# Patient Record
Sex: Female | Born: 1985 | Race: Asian | Hispanic: No | Marital: Married | Smoking: Never smoker
Health system: Southern US, Community
[De-identification: ages and names within clinical notes are randomized; demographics above are authoritative.]

## PROBLEM LIST (undated history)

## (undated) DIAGNOSIS — O24419 Gestational diabetes mellitus in pregnancy, unspecified control: Secondary | ICD-10-CM

## (undated) DIAGNOSIS — Z789 Other specified health status: Secondary | ICD-10-CM

## (undated) HISTORY — DX: Other specified health status: Z78.9

---

## 2017-03-08 LAB — OB RESULTS CONSOLE RUBELLA ANTIBODY, IGM: Rubella: IMMUNE

## 2017-03-08 LAB — OB RESULTS CONSOLE ANTIBODY SCREEN: Antibody Screen: NEGATIVE

## 2017-03-08 LAB — OB RESULTS CONSOLE ABO/RH: RH TYPE: POSITIVE

## 2017-03-08 LAB — OB RESULTS CONSOLE GC/CHLAMYDIA
CHLAMYDIA, DNA PROBE: NEGATIVE
Gonorrhea: NEGATIVE

## 2017-03-08 LAB — OB RESULTS CONSOLE HIV ANTIBODY (ROUTINE TESTING): HIV: NONREACTIVE

## 2017-03-08 LAB — OB RESULTS CONSOLE RPR: RPR: NONREACTIVE

## 2017-03-08 LAB — OB RESULTS CONSOLE HEPATITIS B SURFACE ANTIGEN: Hepatitis B Surface Ag: NEGATIVE

## 2017-03-28 NOTE — L&D Delivery Note (Signed)
Patient was C/C/+2 and pushed for <715minutes with no epidural.   NSVD female infant, Apgars 9/9, weight pending.   The patient had bilateral periurethral abrasions not requiring suture. Fundus was firm. EBL was expected amount. Placenta was delivered intact. Vagina was clear.  Delayed cord clamping done for 30-60 seconds while warming baby. Baby was vigorous and doing skin to skin with mother.  Philip AspenALLAHAN, Kyon Bentler

## 2017-08-02 ENCOUNTER — Encounter: Payer: Medicaid Other | Attending: Obstetrics | Admitting: Registered"

## 2017-08-02 DIAGNOSIS — O24419 Gestational diabetes mellitus in pregnancy, unspecified control: Secondary | ICD-10-CM | POA: Diagnosis not present

## 2017-08-02 DIAGNOSIS — Z713 Dietary counseling and surveillance: Secondary | ICD-10-CM | POA: Insufficient documentation

## 2017-08-07 ENCOUNTER — Encounter: Payer: Self-pay | Admitting: Registered"

## 2017-08-07 DIAGNOSIS — Z8632 Personal history of gestational diabetes: Secondary | ICD-10-CM | POA: Insufficient documentation

## 2017-08-07 DIAGNOSIS — O24419 Gestational diabetes mellitus in pregnancy, unspecified control: Secondary | ICD-10-CM | POA: Insufficient documentation

## 2017-08-07 NOTE — Progress Notes (Signed)
Patient was seen on 08/02/2017 for Gestational Diabetes self-management class at the [facility]. The following learning objectives were met by the patient during this course:   States the definition of Gestational Diabetes  States why dietary management is important in controlling blood glucose  Describes the effects each nutrient has on blood glucose levels  Demonstrates ability to create a balanced meal plan  Demonstrates carbohydrate counting   States when to check blood glucose levels  Demonstrates proper blood glucose monitoring techniques  States the effect of stress and exercise on blood glucose levels  States the importance of limiting caffeine and abstaining from alcohol and smoking  Blood glucose monitor given: Accu-Chek Guide Lot # [id number] Exp: 07/01/2018 Blood glucose reading: 92 mg/dl  Patient instructed to monitor glucose levels: FBS: 60 - <95; 1 hour: <140; 2 hour: <120  Patient received handouts:  Nutrition Diabetes and Pregnancy, including carb counting list  Patient will be seen for follow-up as needed.

## 2017-09-04 LAB — OB RESULTS CONSOLE GBS: GBS: NEGATIVE

## 2017-09-18 ENCOUNTER — Telehealth (HOSPITAL_COMMUNITY): Payer: Self-pay | Admitting: *Deleted

## 2017-09-18 ENCOUNTER — Other Ambulatory Visit: Payer: Self-pay | Admitting: Obstetrics and Gynecology

## 2017-09-18 ENCOUNTER — Encounter (HOSPITAL_COMMUNITY): Payer: Self-pay | Admitting: *Deleted

## 2017-09-18 NOTE — Telephone Encounter (Signed)
Preadmission screen  

## 2017-09-25 ENCOUNTER — Inpatient Hospital Stay (HOSPITAL_COMMUNITY)
Admission: AD | Admit: 2017-09-25 | Discharge: 2017-09-27 | DRG: 797 | Disposition: A | Discharge status: Home / Self Care | Payer: Medicaid Other | Attending: Obstetrics and Gynecology | Admitting: Obstetrics and Gynecology

## 2017-09-25 ENCOUNTER — Encounter (HOSPITAL_COMMUNITY): Payer: Self-pay

## 2017-09-25 DIAGNOSIS — O9089 Other complications of the puerperium, not elsewhere classified: Secondary | ICD-10-CM | POA: Diagnosis not present

## 2017-09-25 DIAGNOSIS — Z3A38 38 weeks gestation of pregnancy: Secondary | ICD-10-CM | POA: Diagnosis not present

## 2017-09-25 DIAGNOSIS — Z3490 Encounter for supervision of normal pregnancy, unspecified, unspecified trimester: Secondary | ICD-10-CM

## 2017-09-25 DIAGNOSIS — O9081 Anemia of the puerperium: Secondary | ICD-10-CM | POA: Diagnosis not present

## 2017-09-25 DIAGNOSIS — D62 Acute posthemorrhagic anemia: Secondary | ICD-10-CM | POA: Diagnosis not present

## 2017-09-25 DIAGNOSIS — Z3483 Encounter for supervision of other normal pregnancy, third trimester: Secondary | ICD-10-CM | POA: Diagnosis present

## 2017-09-25 DIAGNOSIS — R000 Tachycardia, unspecified: Secondary | ICD-10-CM | POA: Diagnosis not present

## 2017-09-25 HISTORY — DX: Gestational diabetes mellitus in pregnancy, unspecified control: O24.419

## 2017-09-25 LAB — CBC
HCT: 25.6 % — ABNORMAL LOW (ref 36.0–46.0)
HEMATOCRIT: 38.1 % (ref 36.0–46.0)
HEMATOCRIT: 31.1 % — AB (ref 36.0–46.0)
HEMOGLOBIN: 12.3 g/dL (ref 12.0–15.0)
Hemoglobin: 9.9 g/dL — ABNORMAL LOW (ref 12.0–15.0)
Hemoglobin: 8.4 g/dL — ABNORMAL LOW (ref 12.0–15.0)
MCH: 28 pg (ref 26.0–34.0)
MCH: 27.7 pg (ref 26.0–34.0)
MCH: 28.7 pg (ref 26.0–34.0)
MCHC: 32.3 g/dL (ref 30.0–36.0)
MCHC: 31.8 g/dL (ref 30.0–36.0)
MCHC: 32.8 g/dL (ref 30.0–36.0)
MCV: 86.6 fL (ref 78.0–100.0)
MCV: 86.9 fL (ref 78.0–100.0)
MCV: 87.4 fL (ref 78.0–100.0)
Platelets: 190 10*3/uL (ref 150–400)
Platelets: 177 10*3/uL (ref 150–400)
Platelets: 158 10*3/uL (ref 150–400)
RBC: 4.4 MIL/uL (ref 3.87–5.11)
RBC: 3.58 MIL/uL — ABNORMAL LOW (ref 3.87–5.11)
RBC: 2.93 MIL/uL — ABNORMAL LOW (ref 3.87–5.11)
RDW: 13.8 % (ref 11.5–15.5)
RDW: 13.9 % (ref 11.5–15.5)
RDW: 14 % (ref 11.5–15.5)
WBC: 14.5 10*3/uL — ABNORMAL HIGH (ref 4.0–10.5)
WBC: 15.1 10*3/uL — ABNORMAL HIGH (ref 4.0–10.5)
WBC: 16.5 10*3/uL — ABNORMAL HIGH (ref 4.0–10.5)

## 2017-09-25 LAB — RPR: RPR Ser Ql: NONREACTIVE

## 2017-09-25 LAB — ABO/RH: ABO/RH(D): O POS

## 2017-09-25 LAB — PREPARE RBC (CROSSMATCH)

## 2017-09-25 LAB — POCT FERN TEST: POCT FERN TEST: POSITIVE

## 2017-09-25 MED ORDER — ZOLPIDEM TARTRATE 5 MG PO TABS: 5.0000 mg | ORAL_TABLET | Freq: Every evening | ORAL | Status: DC | PRN

## 2017-09-25 MED ORDER — LACTATED RINGERS IV SOLN: INTRAVENOUS | Status: DC

## 2017-09-25 MED ORDER — EPHEDRINE 5 MG/ML INJ
10.0000 mg | INTRAVENOUS | Status: DC | PRN
  Filled 2017-09-25: qty 2

## 2017-09-25 MED ORDER — ONDANSETRON HCL 4 MG/2ML IJ SOLN: 4.0000 mg | Freq: Four times a day (QID) | INTRAMUSCULAR | Status: DC | PRN

## 2017-09-25 MED ORDER — COCONUT OIL OIL: 1.0000 "application " | TOPICAL_OIL | Status: DC | PRN

## 2017-09-25 MED ORDER — CARBOPROST TROMETHAMINE 250 MCG/ML IM SOLN
250.0000 ug | Freq: Once | INTRAMUSCULAR | Status: AC
  Administered 2017-09-25: 250 ug via INTRAMUSCULAR

## 2017-09-25 MED ORDER — SIMETHICONE 80 MG PO CHEW: 80.0000 mg | CHEWABLE_TABLET | ORAL | Status: DC | PRN

## 2017-09-25 MED ORDER — IBUPROFEN 600 MG PO TABS
600.0000 mg | ORAL_TABLET | Freq: Four times a day (QID) | ORAL | Status: DC
  Administered 2017-09-25 (×2): 600 mg via ORAL
  Filled 2017-09-25 (×2): qty 1

## 2017-09-25 MED ORDER — DIPHENHYDRAMINE HCL 25 MG PO CAPS: 25.0000 mg | ORAL_CAPSULE | Freq: Four times a day (QID) | ORAL | Status: DC | PRN

## 2017-09-25 MED ORDER — METHYLERGONOVINE MALEATE 0.2 MG/ML IJ SOLN
0.2000 mg | INTRAMUSCULAR | Status: DC | PRN
  Administered 2017-09-25: 0.2 mg via INTRAMUSCULAR
  Filled 2017-09-25: qty 1

## 2017-09-25 MED ORDER — OXYTOCIN 40 UNITS IN LACTATED RINGERS INFUSION - SIMPLE MED
INTRAVENOUS | Status: AC
  Filled 2017-09-25: qty 1000

## 2017-09-25 MED ORDER — OXYCODONE-ACETAMINOPHEN 5-325 MG PO TABS: 2.0000 | ORAL_TABLET | ORAL | Status: DC | PRN

## 2017-09-25 MED ORDER — ACETAMINOPHEN 325 MG PO TABS: 650.0000 mg | ORAL_TABLET | ORAL | Status: DC | PRN

## 2017-09-25 MED ORDER — OXYTOCIN 40 UNITS IN LACTATED RINGERS INFUSION - SIMPLE MED
10.0000 [IU]/h | INTRAVENOUS | Status: DC
  Administered 2017-09-25: 10 [IU]/h via INTRAVENOUS

## 2017-09-25 MED ORDER — SENNOSIDES-DOCUSATE SODIUM 8.6-50 MG PO TABS: 2.0000 | ORAL_TABLET | ORAL | Status: DC

## 2017-09-25 MED ORDER — LACTATED RINGERS IV SOLN: 500.0000 mL | INTRAVENOUS | Status: DC | PRN

## 2017-09-25 MED ORDER — FENTANYL 2.5 MCG/ML BUPIVACAINE 1/10 % EPIDURAL INFUSION (WH - ANES): 14.0000 mL/h | INTRAMUSCULAR | Status: DC | PRN

## 2017-09-25 MED ORDER — PRENATAL MULTIVITAMIN CH: 1.0000 | ORAL_TABLET | Freq: Every day | ORAL | Status: DC

## 2017-09-25 MED ORDER — MISOPROSTOL 200 MCG PO TABS
800.0000 ug | ORAL_TABLET | Freq: Once | ORAL | Status: AC
Start: 2017-09-25 — End: 2017-09-25
  Administered 2017-09-25: 800 ug via RECTAL
  Filled 2017-09-25: qty 4

## 2017-09-25 MED ORDER — BENZOCAINE-MENTHOL 20-0.5 % EX AERO: 1.0000 "application " | INHALATION_SPRAY | CUTANEOUS | Status: DC | PRN

## 2017-09-25 MED ORDER — TRANEXAMIC ACID 1000 MG/10ML IV SOLN
1000.0000 mg | Freq: Once | INTRAVENOUS | Status: DC
  Filled 2017-09-25: qty 10

## 2017-09-25 MED ORDER — OXYTOCIN 40 UNITS IN LACTATED RINGERS INFUSION - SIMPLE MED
2.5000 [IU]/h | INTRAVENOUS | Status: DC
  Administered 2017-09-25: 36 [IU]/h via INTRAVENOUS
  Filled 2017-09-25: qty 1000

## 2017-09-25 MED ORDER — PHENYLEPHRINE 40 MCG/ML (10ML) SYRINGE FOR IV PUSH (FOR BLOOD PRESSURE SUPPORT)
80.0000 ug | PREFILLED_SYRINGE | INTRAVENOUS | Status: DC | PRN
  Filled 2017-09-25: qty 5

## 2017-09-25 MED ORDER — LIDOCAINE HCL (PF) 1 % IJ SOLN
30.0000 mL | INTRAMUSCULAR | Status: DC | PRN
  Filled 2017-09-25: qty 30

## 2017-09-25 MED ORDER — LACTATED RINGERS IV SOLN: 500.0000 mL | Freq: Once | INTRAVENOUS | Status: DC

## 2017-09-25 MED ORDER — TRANEXAMIC ACID 1000 MG/10ML IV SOLN
1000.0000 mg | Freq: Once | INTRAVENOUS | Status: AC
  Administered 2017-09-25: 1000 mg via INTRAVENOUS
  Filled 2017-09-25: qty 1100

## 2017-09-25 MED ORDER — DIPHENHYDRAMINE HCL 50 MG/ML IJ SOLN: 12.5000 mg | INTRAMUSCULAR | Status: DC | PRN

## 2017-09-25 MED ORDER — METHYLERGONOVINE MALEATE 0.2 MG PO TABS
0.2000 mg | ORAL_TABLET | ORAL | Status: DC | PRN
  Administered 2017-09-25 (×2): 0.2 mg via ORAL
  Filled 2017-09-25 (×2): qty 1

## 2017-09-25 MED ORDER — OXYCODONE-ACETAMINOPHEN 5-325 MG PO TABS: 1.0000 | ORAL_TABLET | ORAL | Status: DC | PRN

## 2017-09-25 MED ORDER — SOD CITRATE-CITRIC ACID 500-334 MG/5ML PO SOLN: 30.0000 mL | ORAL | Status: DC | PRN

## 2017-09-25 MED ORDER — MISOPROSTOL 200 MCG PO TABS: 800.0000 ug | ORAL_TABLET | Freq: Once | ORAL | Status: DC

## 2017-09-25 MED ORDER — CARBOPROST TROMETHAMINE 250 MCG/ML IM SOLN
250.0000 ug | INTRAMUSCULAR | Status: DC | PRN
  Filled 2017-09-25: qty 1

## 2017-09-25 MED ORDER — OXYCODONE-ACETAMINOPHEN 5-325 MG PO TABS
2.0000 | ORAL_TABLET | ORAL | Status: DC | PRN
  Administered 2017-09-25: 2 via ORAL
  Filled 2017-09-25: qty 2

## 2017-09-25 MED ORDER — OXYTOCIN BOLUS FROM INFUSION
500.0000 mL | Freq: Once | INTRAVENOUS | Status: AC
  Administered 2017-09-25: 500 mL via INTRAVENOUS

## 2017-09-25 MED ORDER — OXYTOCIN 10 UNIT/ML IJ SOLN
INTRAMUSCULAR | Status: AC
  Filled 2017-09-25: qty 1

## 2017-09-25 MED ORDER — METHYLERGONOVINE MALEATE 0.2 MG/ML IJ SOLN: 0.2000 mg | Freq: Once | INTRAMUSCULAR | Status: DC

## 2017-09-25 MED ORDER — TETANUS-DIPHTH-ACELL PERTUSSIS 5-2.5-18.5 LF-MCG/0.5 IM SUSP: 0.5000 mL | Freq: Once | INTRAMUSCULAR | Status: DC

## 2017-09-25 MED ORDER — DIBUCAINE 1 % RE OINT: 1.0000 "application " | TOPICAL_OINTMENT | RECTAL | Status: DC | PRN

## 2017-09-25 MED ORDER — OXYCODONE-ACETAMINOPHEN 5-325 MG PO TABS
1.0000 | ORAL_TABLET | ORAL | Status: DC | PRN
  Administered 2017-09-25 – 2017-09-26 (×2): 1 via ORAL
  Filled 2017-09-25 (×2): qty 1

## 2017-09-25 MED ORDER — DIPHENOXYLATE-ATROPINE 2.5-0.025 MG PO TABS
2.0000 | ORAL_TABLET | Freq: Four times a day (QID) | ORAL | Status: DC | PRN
  Administered 2017-09-25: 2 via ORAL
  Filled 2017-09-25: qty 2

## 2017-09-25 MED ORDER — ONDANSETRON HCL 4 MG/2ML IJ SOLN: 4.0000 mg | INTRAMUSCULAR | Status: DC | PRN

## 2017-09-25 MED ORDER — WITCH HAZEL-GLYCERIN EX PADS: 1.0000 "application " | MEDICATED_PAD | CUTANEOUS | Status: DC | PRN

## 2017-09-25 MED ORDER — ONDANSETRON HCL 4 MG PO TABS: 4.0000 mg | ORAL_TABLET | ORAL | Status: DC | PRN

## 2017-09-25 MED ORDER — FLEET ENEMA 7-19 GM/118ML RE ENEM: 1.0000 | ENEMA | RECTAL | Status: DC | PRN

## 2017-09-25 NOTE — MAU Note (Signed)
UC's q 2 minutes srom 0610

## 2017-09-25 NOTE — Progress Notes (Signed)
I received several calls regarding heavier than nl PP bleeding. I contacted the delivering MD and discussed the delivery. She was not concerned about a cervical tear or retained placenta. Pt was started on methergine and cytotec 800mg  was placed in her rectum. I just checked the pt There were no clots in the vagina. She has a large cx which is edematous. Uterus is firm. There is currently very little bleeding occurring. Will check a CBC. Continue methergine

## 2017-09-25 NOTE — Progress Notes (Signed)
Received call stating that pt passed another clot and that she became dizzy when she was getting back into bed. Her uterus feels firm. Pt has received cytotec and methergine.  Plan/ 1) will check CBC          2) Hemabate 250 Im          3) Lysteda 1000mg  IV         4) T&C 2 units         5) Start IV with pit         6) If not improving will to OR for EUA and D&C. Discussed possible hyst.

## 2017-09-25 NOTE — Lactation Note (Signed)
This note was copied from a baby's chart. Lactation Consultation Note  Patient Name: Audrey Cantrell'UToday's Date: 09/25/2017 Reason for consult: Initial assessment;Early term 37-38.6wks;Other (Comment)(PPH > 1500 cc)   Initial assessment with exp BF mom of 9 hour old infant. Infant with 3 BF for 10-20 minutes, 1 BF Attempt, 1 spoon feeding of 2 cc EBM, 2 voids, 1 stools (and MSF) since birth. LATCH scores 9.   LC was informed by RN that mom has had 1500 cc blood loss. Note also indicates infant with tight anterior frenulum. Infant is noted to have anterior lingual frenulum that inserts near the tip of the tongue. Mom denies pain with feeding, frenulum was not discussed with parents at this time.   Infant was awakened by RN exam. He was placed STS and offered the breast by mom. Mom needed some help with positioning the NB at the breast. Infant was not willing to latch and was not rooting. Mom hand expressed colostrum and we spoon fed infant 2 cc colostrum. Spoon feeding demonstrated to mom.  Infant was not interested in feeding, he was left STS with mom in deep sleep. Enc mom to spoon feed infant with feedings as colostrum is available.   Enc mom to feed infant STS 8-12 x in 24 hours at first feeding cues, offering both breasts with each feeding. Enc mom to support infant well at the breast and keep infant awake as needed. Enc mom to call out for assistance as needed.   [facility] and anterior frenulum, DEBP was set up for mom. Mom was informed of how to use on initiate setting, assembling, disassembling and cleaning of pump parts. Enc mom to start pumping later this evening post infant bath and rewarming. Feeding plan written on the white board in the room. Reviewed storage of breast milk at room temperature. Asked FOB to assist mom with cleaning of pump parts. Reviewed with mom what to expect with pumping and milk coming to volume. Mom voiced concerns that she does not have enough milk, discussed  colostrum and NB nutritional needs. Mom exclusively BF her 89 yo for 6 months.   BF Resources Handout and LC Brochure given, mom informed of IP/OP Services, BF Support Groups and LC phone #. [facility], she has not spoken with them at the hospital as of yet. Mom does not have a pump at home.   Feeding plan written on the white board:  Breast feed with feeding cues 8-12 x in 24 hours Supplement with any available colostrum via spoon (extra spoons left in the room) Pump for 15 minutes on initiate setting Hand expression after pumping.   Plan of care discussed with Wendall PapaEmily Ray, RN. Plan is to follow up with mom tomorrow and prn.    Maternal Data Formula Feeding for Exclusion: No Has patient been taught Hand Expression?: Yes Does the patient have breastfeeding experience prior to this delivery?: Yes  Feeding Feeding Type: Breast Fed Length of feed: 5 min  LATCH Score Latch: Too sleepy or reluctant, no latch achieved, no sucking elicited.  Audible Swallowing: None  Type of Nipple: Everted at rest and after stimulation  Comfort (Breast/Nipple): Soft / non-tender  Hold (Positioning): Assistance needed to correctly position infant at breast and maintain latch.  LATCH Score: 5  Interventions Interventions: Breast feeding basics reviewed;Support pillows;Assisted with latch;Position options;Skin to skin;Breast massage;Breast compression;Hand express;DEBP;Expressed milk  Lactation Tools Discussed/Used Tools: Pump Breast pump type: Double-Electric Breast Pump WIC Program: Yes  Pump Review: Setup, frequency, and cleaning;Milk Storage Initiated by:: Noralee Stain, RN, IBCLC Date initiated:: 09/25/17   Consult Status Consult Status: Follow-up Date: 09/26/17 Follow-up type: In-patient    Silas Flood Brendin Situ 09/25/2017, 5:22 PM

## 2017-09-25 NOTE — H&P (Signed)
32 y.o. 834w2d  G2P0 comes in c/o ctx and LOF.  Otherwise has good fetal movement and no bleeding.  Past Medical History:  Diagnosis Date  . GDM (gestational diabetes mellitus)    No past surgical history on file.  OB History  Gravida Para Term Preterm AB Living  2         1  SAB TAB Ectopic Multiple Live Births          1    # Outcome Date GA Lbr Len/2nd Weight Sex Delivery Anes PTL Lv  2 Current           1 Gravida             Social History   Socioeconomic History  . Marital status: Married    Spouse name: Not on file  . Number of children: Not on file  . Years of education: Not on file  . Highest education level: Not on file  Occupational History  . Not on file  Social Needs  . Financial resource strain: Not on file  . Food insecurity:    Worry: Not on file    Inability: Not on file  . Transportation needs:    Medical: Not on file    Non-medical: Not on file  Tobacco Use  . Smoking status: Never Smoker  . Smokeless tobacco: Never Used  Substance and Sexual Activity  . Alcohol use: Not on file  . Drug use: Never  . Sexual activity: Not on file  Lifestyle  . Physical activity:    Days per week: Not on file    Minutes per session: Not on file  . Stress: Not on file  Relationships  . Social connections:    Talks on phone: Not on file    Gets together: Not on file    Attends religious service: Not on file    Active member of club or organization: Not on file    Attends meetings of clubs or organizations: Not on file    Relationship status: Not on file  . Intimate partner violence:    Fear of current or ex partner: Not on file    Emotionally abused: Not on file    Physically abused: Not on file    Forced sexual activity: Not on file  Other Topics Concern  . Not on file  Social History Narrative  . Not on file   Patient has no known allergies.    Prenatal Transfer Tool  Maternal Diabetes: Yes:  Diabetes Type:  Insulin/Medication controlled Genetic  Screening: Normal Maternal Ultrasounds/Referrals: Normal Fetal Ultrasounds or other Referrals:  Fetal echo Maternal Substance Abuse:  No Significant Maternal Medications:  None Significant Maternal Lab Results: Lab values include: Group B Strep negative  Other PNC: GDMA2 on glyburide    Vitals:   09/25/17 0708  BP: (!) 156/87  Pulse: 70  Resp: 18  Temp: 97.6 F (36.4 C)  TempSrc: Oral  SpO2: 100%    Lungs/Cor:  NAD Abdomen:  soft, gravid Ex:  no cords, erythema SVE:  5cm and meconium per MAU n admission Now delivered   A/P   Admitted with labor  Meconium present, NICU attended delivery  GBS Neg See delivery note  Tian Davison, Luther ParodySIDNEY

## 2017-09-26 ENCOUNTER — Inpatient Hospital Stay (HOSPITAL_COMMUNITY): Payer: Medicaid Other | Admitting: Anesthesiology

## 2017-09-26 ENCOUNTER — Encounter (HOSPITAL_COMMUNITY): Payer: Self-pay | Admitting: Obstetrics and Gynecology

## 2017-09-26 ENCOUNTER — Encounter (HOSPITAL_COMMUNITY)
Admission: AD | Disposition: A | Discharge status: Home / Self Care | Payer: Self-pay | Source: Home / Self Care | Attending: Obstetrics and Gynecology

## 2017-09-26 DIAGNOSIS — Z3490 Encounter for supervision of normal pregnancy, unspecified, unspecified trimester: Secondary | ICD-10-CM

## 2017-09-26 HISTORY — PX: DILATION AND CURETTAGE OF UTERUS: SHX78

## 2017-09-26 LAB — CBC
HCT: 18.5 % — ABNORMAL LOW (ref 36.0–46.0)
HEMATOCRIT: 24.5 % — AB (ref 36.0–46.0)
HEMOGLOBIN: 6.1 g/dL — AB (ref 12.0–15.0)
HEMOGLOBIN: 8.1 g/dL — AB (ref 12.0–15.0)
MCH: 28.6 pg (ref 26.0–34.0)
MCH: 28.7 pg (ref 26.0–34.0)
MCHC: 33 g/dL (ref 30.0–36.0)
MCHC: 33.1 g/dL (ref 30.0–36.0)
MCV: 86.9 fL (ref 78.0–100.0)
MCV: 86.9 fL (ref 78.0–100.0)
Platelets: 135 10*3/uL — ABNORMAL LOW (ref 150–400)
Platelets: 155 10*3/uL (ref 150–400)
RBC: 2.13 MIL/uL — ABNORMAL LOW (ref 3.87–5.11)
RBC: 2.82 MIL/uL — AB (ref 3.87–5.11)
RDW: 14.1 % (ref 11.5–15.5)
RDW: 14.1 % (ref 11.5–15.5)
WBC: 18.3 10*3/uL — ABNORMAL HIGH (ref 4.0–10.5)
WBC: 17.5 10*3/uL — AB (ref 4.0–10.5)

## 2017-09-26 LAB — DIC (DISSEMINATED INTRAVASCULAR COAGULATION) PANEL
FIBRINOGEN: 532 mg/dL — AB (ref 210–475)
PROTHROMBIN TIME: 12.6 s (ref 11.4–15.2)
SMEAR REVIEW: NONE SEEN

## 2017-09-26 LAB — DIC (DISSEMINATED INTRAVASCULAR COAGULATION)PANEL
D-Dimer, Quant: 1.4 ug/mL-FEU — ABNORMAL HIGH (ref 0.00–0.50)
INR: 0.95
Platelets: 155 10*3/uL (ref 150–400)
aPTT: 28 seconds (ref 24–36)

## 2017-09-26 LAB — POSTPARTUM HEMORRHAGE PROTOCOL (BB NOTIFICATION)

## 2017-09-26 SURGERY — DILATION AND CURETTAGE
Anesthesia: Regional

## 2017-09-26 MED ORDER — IBUPROFEN 600 MG PO TABS
600.0000 mg | ORAL_TABLET | Freq: Four times a day (QID) | ORAL | Status: DC
  Administered 2017-09-26 – 2017-09-27 (×5): 600 mg via ORAL
  Filled 2017-09-26 (×5): qty 1

## 2017-09-26 MED ORDER — FENTANYL CITRATE (PF) 250 MCG/5ML IJ SOLN
INTRAMUSCULAR | Status: AC
  Filled 2017-09-26: qty 5

## 2017-09-26 MED ORDER — ONDANSETRON HCL 4 MG/2ML IJ SOLN: 4.0000 mg | INTRAMUSCULAR | Status: DC | PRN

## 2017-09-26 MED ORDER — PROPOFOL 10 MG/ML IV BOLUS
INTRAVENOUS | Status: DC | PRN
  Administered 2017-09-26: 150 mg via INTRAVENOUS

## 2017-09-26 MED ORDER — FENTANYL CITRATE (PF) 100 MCG/2ML IJ SOLN
INTRAMUSCULAR | Status: DC | PRN
  Administered 2017-09-26: 50 ug via INTRAVENOUS
  Administered 2017-09-26: 100 ug via INTRAVENOUS

## 2017-09-26 MED ORDER — MIDAZOLAM HCL 2 MG/2ML IJ SOLN
INTRAMUSCULAR | Status: AC
  Filled 2017-09-26: qty 2

## 2017-09-26 MED ORDER — ONDANSETRON HCL 4 MG PO TABS: 4.0000 mg | ORAL_TABLET | ORAL | Status: DC | PRN

## 2017-09-26 MED ORDER — MIDAZOLAM HCL 2 MG/2ML IJ SOLN
INTRAMUSCULAR | Status: DC | PRN
  Administered 2017-09-26: 1 mg via INTRAVENOUS

## 2017-09-26 MED ORDER — OXYCODONE HCL 5 MG/5ML PO SOLN: 5.0000 mg | Freq: Once | ORAL | Status: DC | PRN

## 2017-09-26 MED ORDER — SIMETHICONE 80 MG PO CHEW: 80.0000 mg | CHEWABLE_TABLET | ORAL | Status: DC | PRN

## 2017-09-26 MED ORDER — TETANUS-DIPHTH-ACELL PERTUSSIS 5-2.5-18.5 LF-MCG/0.5 IM SUSP: 0.5000 mL | Freq: Once | INTRAMUSCULAR | Status: DC

## 2017-09-26 MED ORDER — DEXAMETHASONE SODIUM PHOSPHATE 4 MG/ML IJ SOLN
INTRAMUSCULAR | Status: AC
Start: 2017-09-26 — End: ?
  Filled 2017-09-26: qty 1

## 2017-09-26 MED ORDER — CEFAZOLIN SODIUM-DEXTROSE 2-3 GM-%(50ML) IV SOLR
INTRAVENOUS | Status: DC | PRN
  Administered 2017-09-26: 2 g via INTRAVENOUS

## 2017-09-26 MED ORDER — FENTANYL CITRATE (PF) 100 MCG/2ML IJ SOLN
INTRAMUSCULAR | Status: AC
  Filled 2017-09-26: qty 4

## 2017-09-26 MED ORDER — ONDANSETRON HCL 4 MG/2ML IJ SOLN
INTRAMUSCULAR | Status: DC | PRN
  Administered 2017-09-26: 4 mg via INTRAVENOUS

## 2017-09-26 MED ORDER — LIDOCAINE HCL (CARDIAC) PF 100 MG/5ML IV SOSY
PREFILLED_SYRINGE | INTRAVENOUS | Status: AC
  Filled 2017-09-26: qty 5

## 2017-09-26 MED ORDER — CARBOPROST TROMETHAMINE 250 MCG/ML IM SOLN
INTRAMUSCULAR | Status: AC
  Filled 2017-09-26: qty 1

## 2017-09-26 MED ORDER — MEASLES, MUMPS & RUBELLA VAC ~~LOC~~ INJ
0.5000 mL | INJECTION | Freq: Once | SUBCUTANEOUS | Status: DC
  Filled 2017-09-26: qty 0.5

## 2017-09-26 MED ORDER — CARBOPROST TROMETHAMINE 250 MCG/ML IM SOLN
INTRAMUSCULAR | Status: AC
  Administered 2017-09-26: 250 ug via INTRAMUSCULAR
  Filled 2017-09-26: qty 1

## 2017-09-26 MED ORDER — SENNOSIDES-DOCUSATE SODIUM 8.6-50 MG PO TABS
2.0000 | ORAL_TABLET | ORAL | Status: DC
  Administered 2017-09-26: 2 via ORAL
  Filled 2017-09-26: qty 2

## 2017-09-26 MED ORDER — CEFAZOLIN SODIUM-DEXTROSE 2-4 GM/100ML-% IV SOLN
INTRAVENOUS | Status: AC
  Filled 2017-09-26: qty 100

## 2017-09-26 MED ORDER — ONDANSETRON HCL 4 MG/2ML IJ SOLN
INTRAMUSCULAR | Status: AC
  Filled 2017-09-26: qty 2

## 2017-09-26 MED ORDER — FERROUS SULFATE 325 (65 FE) MG PO TABS
325.0000 mg | ORAL_TABLET | Freq: Two times a day (BID) | ORAL | Status: DC
  Administered 2017-09-26 – 2017-09-27 (×3): 325 mg via ORAL
  Filled 2017-09-26 (×7): qty 1

## 2017-09-26 MED ORDER — DIBUCAINE 1 % RE OINT: 1.0000 "application " | TOPICAL_OINTMENT | RECTAL | Status: DC | PRN

## 2017-09-26 MED ORDER — METHYLERGONOVINE MALEATE 0.2 MG/ML IJ SOLN
INTRAMUSCULAR | Status: AC
  Filled 2017-09-26: qty 1

## 2017-09-26 MED ORDER — PROPOFOL 10 MG/ML IV BOLUS
INTRAVENOUS | Status: AC
  Filled 2017-09-26: qty 20

## 2017-09-26 MED ORDER — ZOLPIDEM TARTRATE 5 MG PO TABS: 5.0000 mg | ORAL_TABLET | Freq: Every evening | ORAL | Status: DC | PRN

## 2017-09-26 MED ORDER — WITCH HAZEL-GLYCERIN EX PADS: 1.0000 "application " | MEDICATED_PAD | CUTANEOUS | Status: DC | PRN

## 2017-09-26 MED ORDER — METHYLERGONOVINE MALEATE 0.2 MG/ML IJ SOLN
INTRAMUSCULAR | Status: DC | PRN
  Administered 2017-09-26: 0.2 mg via INTRAMUSCULAR

## 2017-09-26 MED ORDER — LACTATED RINGERS IV SOLN
INTRAVENOUS | Status: DC | PRN
  Administered 2017-09-26 (×2): via INTRAVENOUS

## 2017-09-26 MED ORDER — CARBOPROST TROMETHAMINE 250 MCG/ML IM SOLN
250.0000 ug | Freq: Once | INTRAMUSCULAR | Status: AC
  Administered 2017-09-26: 250 ug via INTRAMUSCULAR

## 2017-09-26 MED ORDER — SUCCINYLCHOLINE CHLORIDE 200 MG/10ML IV SOSY
PREFILLED_SYRINGE | INTRAVENOUS | Status: AC
  Filled 2017-09-26: qty 10

## 2017-09-26 MED ORDER — OXYCODONE HCL 5 MG PO TABS: 5.0000 mg | ORAL_TABLET | Freq: Once | ORAL | Status: DC | PRN

## 2017-09-26 MED ORDER — COCONUT OIL OIL: 1.0000 "application " | TOPICAL_OIL | Status: DC | PRN

## 2017-09-26 MED ORDER — LIDOCAINE HCL (CARDIAC) PF 100 MG/5ML IV SOSY
PREFILLED_SYRINGE | INTRAVENOUS | Status: DC | PRN
  Administered 2017-09-26: 100 mg via INTRAVENOUS

## 2017-09-26 MED ORDER — OXYTOCIN 40 UNITS IN LACTATED RINGERS INFUSION - SIMPLE MED
INTRAVENOUS | Status: DC | PRN
  Administered 2017-09-26: 1000 mL via INTRAVENOUS

## 2017-09-26 MED ORDER — SUCCINYLCHOLINE CHLORIDE 20 MG/ML IJ SOLN
INTRAMUSCULAR | Status: DC | PRN
  Administered 2017-09-26: 100 mg via INTRAVENOUS

## 2017-09-26 MED ORDER — ONDANSETRON HCL 4 MG/2ML IJ SOLN: 4.0000 mg | Freq: Four times a day (QID) | INTRAMUSCULAR | Status: DC | PRN

## 2017-09-26 MED ORDER — BENZOCAINE-MENTHOL 20-0.5 % EX AERO: 1.0000 "application " | INHALATION_SPRAY | CUTANEOUS | Status: DC | PRN

## 2017-09-26 MED ORDER — ACETAMINOPHEN 325 MG PO TABS: 650.0000 mg | ORAL_TABLET | ORAL | Status: DC | PRN

## 2017-09-26 MED ORDER — FENTANYL CITRATE (PF) 100 MCG/2ML IJ SOLN: 25.0000 ug | INTRAMUSCULAR | Status: DC | PRN

## 2017-09-26 SURGICAL SUPPLY — 15 items
CATH ROBINSON RED A/P 16FR (CATHETERS) ×2 IMPLANT
CLOTH BEACON ORANGE TIMEOUT ST (SAFETY) ×2 IMPLANT
DECANTER SPIKE VIAL GLASS SM (MISCELLANEOUS) ×2 IMPLANT
GLOVE BIOGEL PI IND STRL 7.0 (GLOVE) ×1 IMPLANT
GLOVE BIOGEL PI INDICATOR 7.0 (GLOVE) ×1
GLOVE ECLIPSE 7.0 STRL STRAW (GLOVE) ×4 IMPLANT
GOWN STRL REUS W/TWL LRG LVL3 (GOWN DISPOSABLE) ×6 IMPLANT
PACK VAGINAL MINOR WOMEN LF (CUSTOM PROCEDURE TRAY) ×2 IMPLANT
PAD OB MATERNITY 4.3X12.25 (PERSONAL CARE ITEMS) ×2 IMPLANT
PAD PREP 24X48 CUFFED NSTRL (MISCELLANEOUS) ×2 IMPLANT
SET BERKELEY SUCTION TUBING (SUCTIONS) ×2 IMPLANT
TOWEL OR 17X24 6PK STRL BLUE (TOWEL DISPOSABLE) ×4 IMPLANT
VACURETTE 12 RIGID CVD (CANNULA) ×2 IMPLANT
VACURETTE 14MM CVD 1/2 BASE (CANNULA) ×2 IMPLANT
WATER STERILE IRR 1000ML POUR (IV SOLUTION) ×2 IMPLANT

## 2017-09-26 NOTE — Anesthesia Procedure Notes (Signed)
Procedure Name: Intubation Date/Time: 09/26/2017 2:55 AM Performed by: Elenore Paddy, CRNA Pre-anesthesia Checklist: Patient identified, Emergency Drugs available, Suction available, Patient being monitored and Timeout performed Patient Re-evaluated:Patient Re-evaluated prior to induction Oxygen Delivery Method: Circle system utilized Preoxygenation: Pre-oxygenation with 100% oxygen Induction Type: IV induction Laryngoscope Size: Mac and 3 Grade View: Grade I Number of attempts: 1 Airway Equipment and Method: Stylet Dental Injury: Teeth and Oropharynx as per pre-operative assessment

## 2017-09-26 NOTE — Progress Notes (Signed)
Post Partum Day 1, POD #1 Subjective: Complaining of dizziness with sitting and standing. Bleeding now minimal. Denies nausea and vomiting  Objective: Blood pressure 111/67, pulse (!) 106, temperature 98 F (36.7 C), temperature source Oral, resp. rate 18, height 5\' 3"  (1.6 m), weight 73.5 kg (162 lb), SpO2 100 %, unknown if currently breastfeeding.  Vitals:   09/26/17 0500 09/26/17 0555 09/26/17 0655 09/26/17 0900  BP: 92/71 106/74 114/72 111/67  Pulse: 70 84 98 (!) 106  Resp: 20 18  18   Temp: 97.7 F (36.5 C) 98.7 F (37.1 C)  98 F (36.7 C)  TempSrc: Oral Oral  Oral  SpO2: 100% 99% 98% 100%  Weight:      Height:         Physical Exam:  General: alert, cooperative and appears stated age Lochia: appropriate Uterine Fundus: firm SCDs on  Recent Labs    09/25/17 2339 09/26/17 0625  HGB 8.1* 6.1*  HCT 24.5* 18.5*    Assessment/Plan: 1) Pt with blood loss anemia and dizziness with sitting up. Discussed options for management including blood transfusion. R/B/A reviewed. The patient declines a blood transfusion at this time 2) Discussed importance of ambulation to decrease risk of DVT/PE. Advised patient that the steady can be used to assist patient to the bathroom 3) Pt desires circ but will not consent to circ without discussing with husband first. Husband not available at this time   LOS: 1 day   Waynard ReedsKendra Toyna Erisman 09/26/2017, 10:03 AM

## 2017-09-26 NOTE — Progress Notes (Signed)
Pt had large clots expressed with fundus firm and 1 below umbilicus during each fundal check. No trickle noted and Dr Dareen PianoAnderson notified of patient's EBL at 8pm. No orders received at that time. L&D nurse O. Whitfield and A. Walker present in room at approx. 2230.No trickle of blood noted until after clots expressed with 2230 fundal check. Dr Dareen PianoAnderson on phone receiving updates on patient and orders received for LR, Hemabate and Lysteda, also type and cross for 2 units PRBC. Dr Dareen PianoAnderson in room at 2250 to assess pt. Pt became pale, diaphoretic and nauseous at approx. 2300. VSS. Fundus remained firm and large clots expressed. EBL measured with each fundal check and Dr Dareen PianoAnderson updated throughout event. At approx 0140, Dr Dareen PianoAnderson was called and notified that nurse was uncomfortable with patient's condition and requested he come examine patient in room. At 0150, Dr Dareen PianoAnderson in room and cervical exam done on patient. Prior to exam, Dr. Dareen PianoAnderson stated he did not see any clots and was shown peri pads from prior fundal checks, as well as pads on patient at the time. Dr Dareen PianoAnderson then stated to prep patient for the OR for D&C, possible hysterectomy.

## 2017-09-26 NOTE — Anesthesia Postprocedure Evaluation (Signed)
Anesthesia Post Note  Patient: Audrey BottomMaryam Cantrell  Procedure(s) Performed: DILATATION AND CURETTAGE (N/A )     Patient location during evaluation: Mother Baby Anesthesia Type: Regional and General Level of consciousness: awake and alert and oriented Pain management: pain level controlled Vital Signs Assessment: post-procedure vital signs reviewed and stable Respiratory status: spontaneous breathing and nonlabored ventilation Cardiovascular status: stable Postop Assessment: no headache, patient able to bend at knees, no backache, no apparent nausea or vomiting, epidural receding, adequate PO intake and able to ambulate Anesthetic complications: no    Last Vitals:  Vitals:   09/26/17 0555 09/26/17 0655  BP: 106/74 114/72  Pulse: 84 98  Resp: 18   Temp: 37.1 C   SpO2: 99% 98%    Last Pain:  Vitals:   09/26/17 0555  TempSrc: Oral  PainSc: 0-No pain   Pain Goal: Patients Stated Pain Goal: 3 (09/25/17 1359)               [name],[name]

## 2017-09-26 NOTE — Progress Notes (Signed)
Orthostatic vitals completed. Patient slightly orthostatic with pulse jumping from 106 to 127.  Patient complained of continued dizziness but not feeling light she will pass out.  Patient was able to ambulate to the bathroom with assistance and voided a large amount.  Patient was instructed to call for assistance up and voiced understanding that she cannot walk by herself.   Audrey Cantrell, [place] N

## 2017-09-26 NOTE — Progress Notes (Signed)
On assessment this morning patient stated she was dizzy while sitting in bed.  BP 111/67 pulse 109.  Small trickle with massage, but bladder was full.  Patient was put on the bedpan and voided 600ml.  No bleeding after void.  Dr. Tenny Crawoss was notified and stated she would discuss the possibility of giving blood with the patient when she rounded.

## 2017-09-26 NOTE — Transfer of Care (Signed)
Immediate Anesthesia Transfer of Care Note  Patient: Audrey Cantrell  Procedure(s) Performed: DILATATION AND CURETTAGE (N/A )  Patient Location: PACU  Anesthesia Type:General  Level of Consciousness: awake, alert  and oriented  Airway & Oxygen Therapy: Patient Spontanous Breathing and Patient connected to nasal cannula oxygen  Post-op Assessment: Report given to RN and Post -op Vital signs reviewed and stable  Post vital signs: Reviewed and stable HR 77, RR 16, SaO2 100%, BP 115/82  Last Vitals:  Vitals Value Taken Time  BP 115/82 09/26/2017  3:45 AM  Temp 36.9 C 09/26/2017  3:45 AM  Pulse 80 09/26/2017  3:46 AM  Resp 15 09/26/2017  3:46 AM  SpO2 100 % 09/26/2017  3:46 AM  Vitals shown include unvalidated device data.  Last Pain:  Vitals:   09/26/17 0200  TempSrc: Oral  PainSc:       Patients Stated Pain Goal: 3 (09/25/17 1359)  Complications: No apparent anesthesia complications

## 2017-09-26 NOTE — Op Note (Signed)
NAMSarita Bottom: Mella, Jaeley MEDICAL RECORD NW:[id number] ACCOUNT [id number] DATE OF BIRTH:February 22, 1986 FACILITY: [facility] LOCATION: [id number] Evert KohlPHYSICIAN:Bethannie Iglehart E. Shizuko Wojdyla, MD  OPERATIVE REPORT  DATE OF PROCEDURE:  09/26/2017  PREOPERATIVE DIAGNOSIS:  Continued postpartum heavy bleeding.  POSTOPERATIVE DIAGNOSIS:  Possible retained products of conception.  SURGEON:  Dayna BarkerMatthew Jamoni Broadfoot, MD  ANESTHESIA:  General.  ANTIBIOTICS:  Ancef 2 grams.  DRAINS:  Foley to bedside drainage.  ESTIMATED BLOOD LOSS:  100 mL.  SPECIMENS:  Endometrial curettings and clots sent to pathology.  COMPLICATIONS:  None.  DESCRIPTION OF PROCEDURE:  The patient was taken to the operating room where she was placed on dorsal supine position.  A general anesthetic was administered without difficulty.  She was then placed in dorsal lithotomy position.  She was prepped and  draped in the usual fashion for this procedure.  A sterile speculum was placed in the vagina.  There was no evidence of any vaginal lacerations.  The cervix appeared to have no lacerations.  There was no evidence of any bleeding from the vagina or the  cervix.  At this point, a ring forceps was used to grasp the anterior cervical lip.  A suction curette was placed into the uterine cavity and copious amounts of clot and what appeared to be placenta were removed.  A sharp curettage with a banjo curette  was performed x2 followed by repeat suction.  At the conclusion of this, the patient had minimal bleeding.  An IV with 40 of Pitocin was started.  She was also given Methergine IM.    She will be taken to the recovery room in stable condition.  Instrument and lap counts correct x2.  AN/NUANCE  D:09/26/2017 T:09/26/2017 JOB:[id number]

## 2017-09-26 NOTE — Anesthesia Preprocedure Evaluation (Signed)
Anesthesia Evaluation  Patient identified by MRN, date of birth, ID band Patient awake    Reviewed: Allergy & Precautions, H&P , NPO status , Patient's Chart, lab work & pertinent test results  Airway Mallampati: II   Neck ROM: full    Dental   Pulmonary neg pulmonary ROS,    breath sounds clear to auscultation       Cardiovascular negative cardio ROS   Rhythm:regular Rate:Normal     Neuro/Psych    GI/Hepatic   Endo/Other  diabetes, Gestational  Renal/GU      Musculoskeletal   Abdominal   Peds  Hematology  (+) anemia ,   Anesthesia Other Findings   Reproductive/Obstetrics                             Anesthesia Physical Anesthesia Plan  ASA: II  Anesthesia Plan: Regional   Post-op Pain Management:    Induction: Intravenous, Rapid sequence and Cricoid pressure planned  PONV Risk Score and Plan: 2 and Ondansetron and Treatment may vary due to age or medical condition  Airway Management Planned: Oral ETT  Additional Equipment:   Intra-op Plan:   Post-operative Plan: Extubation in OR  Informed Consent: I have reviewed the patients History and Physical, chart, labs and discussed the procedure including the risks, benefits and alternatives for the proposed anesthesia with the patient or authorized representative who has indicated his/her understanding and acceptance.     Plan Discussed with: CRNA, Anesthesiologist and Surgeon  Anesthesia Plan Comments:         Anesthesia Quick Evaluation

## 2017-09-27 MED ORDER — FERROUS SULFATE 325 (65 FE) MG PO TABS: 325.0000 mg | ORAL_TABLET | Freq: Every day | ORAL | 3 refills | Status: DC

## 2017-09-27 MED ORDER — IBUPROFEN 600 MG PO TABS: 600.0000 mg | ORAL_TABLET | Freq: Four times a day (QID) | ORAL | 0 refills | Status: DC

## 2017-09-27 NOTE — Progress Notes (Signed)
Patient is doing well.  She is ambulating, voiding, tolerating PO.  Pain control is good.  Lochia is appropriate She had a PPH and hgb yesterday was 6.1.  She is still tachycardic in the 110s, but is ambulating well--denies dizziness/lightheadedness.   She is ambulating upon arrival to her room today.  Bleeding since D&C yesterday has been minimal  Vitals:   09/26/17 1127 09/26/17 1407 09/26/17 2232 09/27/17 0500  BP: 107/72 106/60 109/70 115/70  Pulse: (!) 127 (!) 109 (!) 113 (!) 118  Resp:  18 18   Temp:  98.6 F (37 C) 98.8 F (37.1 C) 98.2 F (36.8 C)  TempSrc:  Oral Oral   SpO2: 100% 98%  98%  Weight:      Height:        NAD Fundus firm Peripad w scant heme Ext: no edema  Lab Results  Component Value Date   WBC 18.3 (H) 09/26/2017   HGB 6.1 (LL) 09/26/2017   HCT 18.5 (L) 09/26/2017   MCV 86.9 09/26/2017   PLT 135 (L) 09/26/2017    --/--/O POS Performed at [facility], [address] [place], [place] [address]  (07/01 0705)/[place]  A/P 32 y.o. G2P1002 PPD#2. PPH w ABLA--is doing quite well for hgb 6.1.  Is asymptomatic at this time.  She declined blood transfusion yesterday.  Again, reviewed r/b/a to transfusion and she declines blood today.  She reports that her husband will be at home with her and the baby for the first couple of weeks and she will have good support.  Will arrange for home IV iron (injectafer) infusion since she is declining blood.    Given her minimal bleeding over the last 24 hours and frequent ambulation without symptoms, I feel that discharge this afternoon is reasonable.    Desires circumcision. Discussed r/b/a of the procedure. Reviewed that circumcision is an elective surgical procedure and not considered medically necessary. Reviewed the risks of the procedure including the risk of infection, bleeding, damage to surrounding structures, including scrotum, shaft, urethra and head of penis, and an undesired cosmetic effect requiring  additional procedures for revision. Consent signed.    [facility] HospitalDYANNA Cantrell [employer] CompanyCLARK

## 2017-09-27 NOTE — Discharge Instructions (Signed)
Pelvic rest x 6 weeks (no intercourse or tampons).  No tub baths or swimming for two weeks.    Call your doctor if you have heavy vaginal bleeding (soaking through a pad an hour or more for >2 hours in a row), temperature >101F, severe nausea, vomiting, severe or worsening abdominal pain, dizziness, shortness of breath, chest pain or any other concerns.  Please take motrin every 6 hours.  Continue your prenatal vitamin daily and add one iron tablet daily at a separate time.  We will contact you once we have scheduled your iron infusion for your anemia

## 2017-09-27 NOTE — Discharge Summary (Signed)
Obstetric Discharge Summary Reason for Admission: onset of labor Prenatal Procedures: none Intrapartum Procedures: spontaneous vaginal delivery Postpartum Procedures: D&C for PPH unresponsive to uterotonics and possible retained products of conception Complications-Operative and Postpartum: hemorrhage--post op hgb 6.1.  Pt declined blood transfusion.  On day of discharge, she had mild tachycardia, but was able to ambulate without any symptoms.   Hemoglobin  Date Value Ref Range Status  09/26/2017 6.1 (LL) 12.0 - 15.0 g/dL Final    Comment:    REPEATED TO VERIFY CRITICAL RESULT CALLED TO, READ BACK BY AND VERIFIED WITH: POTTER,K @0700  ON [id number] BY FLEMINGS    HCT  Date Value Ref Range Status  09/26/2017 18.5 (L) 36.0 - 46.0 % Final    Physical Exam:  General: alert, cooperative and appears stated age 43Lochia: appropriate Uterine Fundus: firm DVT Evaluation: No evidence of DVT seen on physical exam.  Discharge Diagnoses: Term Pregnancy-delivered  Discharge Information: Date: 09/27/2017 Activity: pelvic rest Diet: routine Medications: PNV and Ibuprofen Condition: stable Instructions: refer to practice specific booklet Discharge to: home Follow-up Information    Philip AspenCallahan, Sidney, DO Follow up in 4 week(s).   Specialty:  Obstetrics and Gynecology Contact information: [address] [place] [address] [phone]           Newborn Data: Live born female  Birth Weight: 8 lb 3 oz (3714 g) APGAR: 9, 9  Newborn Delivery   Birth date/time:  09/25/2017 07:25:00 Delivery type:  Vaginal, Spontaneous     Home with mother.  Adelayde Minney GEFFEL Kammie Scioli 09/27/2017, 8:40 AM

## 2017-09-27 NOTE — Lactation Note (Signed)
This note was copied from a baby's chart. Lactation Consultation Note; Mother reports that infant is feeding well . She denies having any breastfeeding questions or concerns. Mother reports hand expressing colostrum well. Mother reports hearing infant swallow. She denies having any nipple pain.  Report that mother had a PPH. Discussed need to post pump after each feeding until milk comes to volume  and protect her milk supply. Advised mother to do good breast massage and hand expression to stimulate milk volume.   Mother is active with WIC,. [facility]. She declined reporting that she was comfortable with using the harmony hand pump.  Mother advised to breastfeed infant on cue and feed infant at least 8-12 times in 24 hours. Discussed cluster feeding.  Advised mother to keep accurate account of all infants diapers.   Discussed treatment and prevention of engorgement. Mother was receptive to all teaching. [facility] , BFS[facility] and outpatient dept. Mother reports that she has an appt with WIC.   Patient Name: Audrey Sarita BottomMaryam Cantrell AVWUJ'WToday's Date: 09/27/2017 Reason for consult: Follow-up assessment   Maternal Data    Feeding    LATCH Score                   Interventions    Lactation Tools Discussed/Used     Consult Status Consult Status: Complete    Audrey Cantrell, Audrey Cantrell 09/27/2017, 2:39 PM

## 2017-09-29 LAB — TYPE AND SCREEN
ABO/RH(D): O POS
ANTIBODY SCREEN: NEGATIVE
UNIT DIVISION: 0
UNIT DIVISION: 0
UNIT DIVISION: 0
UNIT DIVISION: 0

## 2017-09-29 LAB — BPAM RBC
BLOOD PRODUCT EXPIRATION DATE: 201907262359
BLOOD PRODUCT EXPIRATION DATE: 201907272359
BLOOD PRODUCT EXPIRATION DATE: 201908062359
Blood Product Expiration Date: 201908072359
ISSUE DATE / TIME: 201907020259
ISSUE DATE / TIME: 201907020259
UNIT TYPE AND RH: 5100
Unit Type and Rh: 5100
Unit Type and Rh: 5100
Unit Type and Rh: 5100

## 2017-09-30 ENCOUNTER — Inpatient Hospital Stay (HOSPITAL_COMMUNITY): Admission: RE | Admit: 2017-09-30 | Payer: Medicaid Other | Source: Ambulatory Visit

## 2017-09-30 ENCOUNTER — Inpatient Hospital Stay (HOSPITAL_COMMUNITY): Payer: Medicaid Other

## 2017-12-26 ENCOUNTER — Encounter: Payer: Self-pay | Admitting: Obstetrics & Gynecology

## 2017-12-26 ENCOUNTER — Encounter: Payer: Self-pay | Admitting: *Deleted

## 2017-12-26 ENCOUNTER — Ambulatory Visit (INDEPENDENT_AMBULATORY_CARE_PROVIDER_SITE_OTHER): Payer: Medicaid Other | Admitting: Obstetrics & Gynecology

## 2017-12-26 VITALS — BP 108/72 | HR 74 | Ht 63.0 in | Wt 131.6 lb

## 2017-12-26 DIAGNOSIS — Z3046 Encounter for surveillance of implantable subdermal contraceptive: Secondary | ICD-10-CM

## 2017-12-26 DIAGNOSIS — Z30017 Encounter for initial prescription of implantable subdermal contraceptive: Secondary | ICD-10-CM

## 2017-12-26 DIAGNOSIS — Z3202 Encounter for pregnancy test, result negative: Secondary | ICD-10-CM | POA: Diagnosis not present

## 2017-12-26 LAB — POCT URINE PREGNANCY: PREG TEST UR: NEGATIVE

## 2017-12-26 MED ORDER — ETONOGESTREL 68 MG ~~LOC~~ IMPL
68.0000 mg | DRUG_IMPLANT | Freq: Once | SUBCUTANEOUS | Status: AC
  Administered 2017-12-26: 68 mg via SUBCUTANEOUS

## 2017-12-26 NOTE — Patient Instructions (Signed)
Nexplanon Instructions After Insertion   Keep bandage clean and dry for 24 hours   May use ice/Tylenol/Ibuprofen for soreness or pain   If you develop fever, drainage or increased warmth from incision site-contact office immediately  Etonogestrel implant What is this medicine? ETONOGESTREL (et oh noe JES trel) is a contraceptive (birth control) device. It is used to prevent pregnancy. It can be used for up to 3 years. This medicine may be used for other purposes; ask your health care provider or pharmacist if you have questions. COMMON BRAND NAME(S): Implanon, Nexplanon What should I tell my health care provider before I take this medicine? They need to know if you have any of these conditions: -abnormal vaginal bleeding -blood vessel disease or blood clots -cancer of the breast, cervix, or liver -depression -diabetes -gallbladder disease -headaches -heart disease or recent heart attack -high blood pressure -high cholesterol -kidney disease -liver disease -renal disease -seizures -tobacco smoker -an unusual or allergic reaction to etonogestrel, other hormones, anesthetics or antiseptics, medicines, foods, dyes, or preservatives -pregnant or trying to get pregnant -breast-feeding How should I use this medicine? This device is inserted just under the skin on the inner side of your upper arm by a health care professional. Talk to your pediatrician regarding the use of this medicine in children. Special care may be needed. Overdosage: If you think you have taken too much of this medicine contact a poison control center or emergency room at once. NOTE: This medicine is only for you. Do not share this medicine with others. What if I miss a dose? This does not apply. What may interact with this medicine? Do not take this medicine with any of the following medications: -amprenavir -bosentan -fosamprenavir This medicine may also interact with the following  medications: -barbiturate medicines for inducing sleep or treating seizures -certain medicines for fungal infections like ketoconazole and itraconazole -grapefruit juice -griseofulvin -medicines to treat seizures like carbamazepine, felbamate, oxcarbazepine, phenytoin, topiramate -modafinil -phenylbutazone -rifampin -rufinamide -some medicines to treat HIV infection like atazanavir, indinavir, lopinavir, nelfinavir, tipranavir, ritonavir -St. John's wort This list may not describe all possible interactions. Give your health care provider a list of all the medicines, herbs, non-prescription drugs, or dietary supplements you use. Also tell them if you smoke, drink alcohol, or use illegal drugs. Some items may interact with your medicine. What should I watch for while using this medicine? This product does not protect you against HIV infection (AIDS) or other sexually transmitted diseases. You should be able to feel the implant by pressing your fingertips over the skin where it was inserted. Contact your doctor if you cannot feel the implant, and use a non-hormonal birth control method (such as condoms) until your doctor confirms that the implant is in place. If you feel that the implant may have broken or become bent while in your arm, contact your healthcare provider. What side effects may I notice from receiving this medicine? Side effects that you should report to your doctor or health care professional as soon as possible: -allergic reactions like skin rash, itching or hives, swelling of the face, lips, or tongue -breast lumps -changes in emotions or moods -depressed mood -heavy or prolonged menstrual bleeding -pain, irritation, swelling, or bruising at the insertion site -scar at site of insertion -signs of infection at the insertion site such as fever, and skin redness, pain or discharge -signs of pregnancy -signs and symptoms of a blood clot such as breathing problems; changes in  vision; chest pain; severe,   sudden headache; pain, swelling, warmth in the leg; trouble speaking; sudden numbness or weakness of the face, arm or leg -signs and symptoms of liver injury like dark yellow or brown urine; general ill feeling or flu-like symptoms; light-colored stools; loss of appetite; nausea; right upper belly pain; unusually weak or tired; yellowing of the eyes or skin -unusual vaginal bleeding, discharge -signs and symptoms of a stroke like changes in vision; confusion; trouble speaking or understanding; severe headaches; sudden numbness or weakness of the face, arm or leg; trouble walking; dizziness; loss of balance or coordination Side effects that usually do not require medical attention (report to your doctor or health care professional if they continue or are bothersome): -acne -back pain -breast pain -changes in weight -dizziness -general ill feeling or flu-like symptoms -headache -irregular menstrual bleeding -nausea -sore throat -vaginal irritation or inflammation This list may not describe all possible side effects. Call your doctor for medical advice about side effects. You may report side effects to FDA at 1-800-FDA-1088. Where should I keep my medicine? This drug is given in a hospital or clinic and will not be stored at home. NOTE: This sheet is a summary. It may not cover all possible information. If you have questions about this medicine, talk to your doctor, pharmacist, or health care provider.  2018 Elsevier/Gold Standard (2015-10-01 11:19:22)  

## 2017-12-26 NOTE — Progress Notes (Signed)
Patient given informed consent, she signed consent form. Pregnancy test was negative.  Appropriate time out taken.  Patient's left arm was prepped and draped in the usual sterile fashion.. The ruler used to measure and mark insertion area.  Patient was prepped with alcohol swab and then injected with 5 ml of 1 % lidocaine.  She was prepped with betadine, Nexplanon removed from packaging,  Device confirmed in needle, then inserted full length of needle and withdrawn per handbook instructions.  There was minimal blood loss.  Patient insertion site covered with guaze and a pressure bandage to reduce any bruising.  The patient tolerated the procedure well and was given post procedure instructions. Return in about 3 months for Nexplanon check.  Adeleigh Barletta L. Harraway-Smith, M.D., [facility]

## 2017-12-26 NOTE — Progress Notes (Signed)
Patient is in the office for nexplanon insertion, last pap 03-08-17. Transferred from [facility].

## 2018-03-23 ENCOUNTER — Ambulatory Visit (INDEPENDENT_AMBULATORY_CARE_PROVIDER_SITE_OTHER): Payer: Self-pay | Admitting: Obstetrics & Gynecology

## 2018-03-23 ENCOUNTER — Encounter: Payer: Self-pay | Admitting: Obstetrics & Gynecology

## 2018-03-23 VITALS — BP 105/69 | HR 96 | Wt 133.8 lb

## 2018-03-23 DIAGNOSIS — Z975 Presence of (intrauterine) contraceptive device: Secondary | ICD-10-CM

## 2018-03-23 DIAGNOSIS — N921 Excessive and frequent menstruation with irregular cycle: Secondary | ICD-10-CM

## 2018-03-23 NOTE — Progress Notes (Signed)
Pt is here for nexplanon F/U, nexplanon inserted 10/19. No questions or concerns. Pt reports still having some irregular bleeding.

## 2018-03-23 NOTE — Progress Notes (Signed)
   Subjective:    Patient ID: Sarita BottomMaryam Figley, female    DOB: Nov 01, 1985, 32 y.o.   MRN: [id number]  HPI 32 yo married P2 (6 month and 33 year old sons) here to discuss her Nexplanon. It was placed 3 months ago. She had bleeding for 2 months straight. I has lightened up some, but still has bleeding.   Review of Systems [facility]    Objective:   Physical Exam Breathing, conversing, and ambulating normally Well nourished, well hydrated [place] female, no apparent distress Abd- benign     Assessment & Plan:  Irregular bleeding with Nexplanon- trial of IBU 200 mg TID prn If this doesn't work, I asked her to message me so that I can prescribe OCPs prn

## 2018-04-18 ENCOUNTER — Other Ambulatory Visit: Payer: Self-pay | Admitting: Obstetrics & Gynecology

## 2018-04-18 MED ORDER — NORGESTREL-ETHINYL ESTRADIOL 0.3-30 MG-MCG PO TABS: ORAL_TABLET | ORAL | 11 refills | Status: DC

## 2018-04-18 NOTE — Progress Notes (Signed)
Lo ovral prescribed to be taken prn bleeding with Nexplanon

## 2018-05-11 ENCOUNTER — Encounter: Payer: Self-pay | Admitting: *Deleted

## 2018-05-11 ENCOUNTER — Other Ambulatory Visit: Payer: Self-pay

## 2018-05-11 ENCOUNTER — Ambulatory Visit (INDEPENDENT_AMBULATORY_CARE_PROVIDER_SITE_OTHER): Payer: Medicaid Other | Admitting: Obstetrics and Gynecology

## 2018-05-11 ENCOUNTER — Encounter: Payer: Self-pay | Admitting: Obstetrics and Gynecology

## 2018-05-11 VITALS — BP 104/71 | HR 73 | Ht 63.0 in | Wt 135.0 lb

## 2018-05-11 DIAGNOSIS — Z3042 Encounter for surveillance of injectable contraceptive: Secondary | ICD-10-CM

## 2018-05-11 DIAGNOSIS — Z3046 Encounter for surveillance of implantable subdermal contraceptive: Secondary | ICD-10-CM

## 2018-05-11 MED ORDER — MEDROXYPROGESTERONE ACETATE 150 MG/ML IM SUSP
150.0000 mg | Freq: Once | INTRAMUSCULAR | Status: AC
  Administered 2018-05-11: 150 mg via INTRAMUSCULAR

## 2018-05-11 MED ORDER — MEDROXYPROGESTERONE ACETATE 150 MG/ML IM SUSP: 150.0000 mg | INTRAMUSCULAR | 4 refills | Status: DC

## 2018-05-11 NOTE — Progress Notes (Signed)
33 yo G2P2 here for nexplanon removal. Patient had nexplanon since 12/2017 and has been experiencing irregular vaginal bleeding despite using added COC. She plans on using Depo-provera today  Nexplanon Removal Patient given informed consent for removal of her Implanon, time out was performed.  Signed copy in the chart.  Appropriate time out taken. Implanon site identified.  Area prepped in usual sterile fashon. One cc of 1% lidocaine was used to anesthetize the area at the distal end of the implant. A small stab incision was made right beside the implant on the distal portion.  The implanon rod was grasped using hemostats and removed without difficulty.  There was less than 3 cc blood loss. There were no complications.  A small amount of antibiotic ointment and steri-strips were applied over the small incision.  A pressure bandage was applied to reduce any bruising.  The patient tolerated the procedure well and was given post procedure instructions.  First dose of depo-provera today Rx for depo-provera provided Patient with normal pap smear within the last 12 months

## 2018-05-11 NOTE — Progress Notes (Addendum)
Presents for removal of Nexplanon, it was inserted 12/26/2017.  C/o bleeding since insertion and fatigue.  She was placed on OCP and it has not stopped the bleeding.  She wants to switch to DEPO.  DEPO given in LD, tolerated well.   Next DEPO 05/2-16/2020  Administrations This Visit    medroxyPROGESTERone (DEPO-PROVERA) injection 150 mg    Admin Date 05/11/2018 Action Given Dose 150 mg Route Intramuscular Administered By Maretta Bees, RMA

## 2018-08-13 ENCOUNTER — Ambulatory Visit: Payer: Medicaid Other

## 2019-08-15 ENCOUNTER — Ambulatory Visit: Payer: Medicaid Other | Attending: Internal Medicine

## 2019-08-15 ENCOUNTER — Other Ambulatory Visit: Payer: Self-pay

## 2019-08-15 DIAGNOSIS — Z20822 Contact with and (suspected) exposure to covid-19: Secondary | ICD-10-CM

## 2019-08-16 LAB — SARS-COV-2, NAA 2 DAY TAT

## 2019-08-16 LAB — NOVEL CORONAVIRUS, NAA: SARS-CoV-2, NAA: NOT DETECTED

## 2020-03-17 ENCOUNTER — Encounter: Payer: Self-pay | Admitting: Obstetrics and Gynecology

## 2020-03-17 ENCOUNTER — Other Ambulatory Visit (HOSPITAL_COMMUNITY)
Admission: RE | Admit: 2020-03-17 | Discharge: 2020-03-17 | Disposition: A | Discharge status: Home / Self Care | Payer: Medicaid Other | Source: Ambulatory Visit | Attending: Obstetrics and Gynecology | Admitting: Obstetrics and Gynecology

## 2020-03-17 ENCOUNTER — Other Ambulatory Visit: Payer: Self-pay

## 2020-03-17 ENCOUNTER — Ambulatory Visit (INDEPENDENT_AMBULATORY_CARE_PROVIDER_SITE_OTHER): Payer: Medicaid Other | Admitting: Obstetrics and Gynecology

## 2020-03-17 VITALS — BP 101/69 | HR 102 | Ht 63.0 in | Wt 143.0 lb

## 2020-03-17 DIAGNOSIS — Z1151 Encounter for screening for human papillomavirus (HPV): Secondary | ICD-10-CM | POA: Insufficient documentation

## 2020-03-17 DIAGNOSIS — Z124 Encounter for screening for malignant neoplasm of cervix: Secondary | ICD-10-CM | POA: Diagnosis not present

## 2020-03-17 DIAGNOSIS — R87610 Atypical squamous cells of undetermined significance on cytologic smear of cervix (ASC-US): Secondary | ICD-10-CM | POA: Diagnosis not present

## 2020-03-17 DIAGNOSIS — Z01419 Encounter for gynecological examination (general) (routine) without abnormal findings: Secondary | ICD-10-CM | POA: Diagnosis present

## 2020-03-17 NOTE — Addendum Note (Signed)
Addended by: Kennon Portela on: 03/17/2020 11:31 AM   Modules accepted: Orders

## 2020-03-17 NOTE — Patient Instructions (Signed)
Levonorgestrel intrauterine device (IUD) What is this medicine? LEVONORGESTREL IUD (LEE voe nor jes trel) is a contraceptive (birth control) device. The device is placed inside the uterus by a healthcare professional. It is used to prevent pregnancy. This device can also be used to treat heavy bleeding that occurs during your period. This medicine may be used for other purposes; ask your health care provider or pharmacist if you have questions. COMMON BRAND NAME(S): Kyleena, LILETTA, Mirena, Skyla What should I tell my health care provider before I take this medicine? They need to know if you have any of these conditions:  abnormal Pap smear  cancer of the breast, uterus, or cervix  diabetes  endometritis  genital or pelvic infection now or in the past  have more than one sexual partner or your partner has more than one partner  heart disease  history of an ectopic or tubal pregnancy  immune system problems  IUD in place  liver disease or tumor  problems with blood clots or take blood-thinners  seizures  use intravenous drugs  uterus of unusual shape  vaginal bleeding that has not been explained  an unusual or allergic reaction to levonorgestrel, other hormones, silicone, or polyethylene, medicines, foods, dyes, or preservatives  pregnant or trying to get pregnant  breast-feeding How should I use this medicine? This device is placed inside the uterus by a health care professional. Talk to your pediatrician regarding the use of this medicine in children. Special care may be needed. Overdosage: If you think you have taken too much of this medicine contact a poison control center or emergency room at once. NOTE: This medicine is only for you. Do not share this medicine with others. What if I miss a dose? This does not apply. Depending on the brand of device you have inserted, the device will need to be replaced every 3 to 6 years if you wish to continue using this type  of birth control. What may interact with this medicine? Do not take this medicine with any of the following medications:  amprenavir  bosentan  fosamprenavir This medicine may also interact with the following medications:  aprepitant  armodafinil  barbiturate medicines for inducing sleep or treating seizures  bexarotene  boceprevir  griseofulvin  medicines to treat seizures like carbamazepine, ethotoin, felbamate, oxcarbazepine, phenytoin, topiramate  modafinil  pioglitazone  rifabutin  rifampin  rifapentine  some medicines to treat HIV infection like atazanavir, efavirenz, indinavir, lopinavir, nelfinavir, tipranavir, ritonavir  St. John's wort  warfarin This list may not describe all possible interactions. Give your health care provider a list of all the medicines, herbs, non-prescription drugs, or dietary supplements you use. Also tell them if you smoke, drink alcohol, or use illegal drugs. Some items may interact with your medicine. What should I watch for while using this medicine? Visit your doctor or health care professional for regular check ups. See your doctor if you or your partner has sexual contact with others, becomes HIV positive, or gets a sexual transmitted disease. This product does not protect you against HIV infection (AIDS) or other sexually transmitted diseases. You can check the placement of the IUD yourself by reaching up to the top of your vagina with clean fingers to feel the threads. Do not pull on the threads. It is a good habit to check placement after each menstrual period. Call your doctor right away if you feel more of the IUD than just the threads or if you cannot feel the threads at   all. The IUD may come out by itself. You may become pregnant if the device comes out. If you notice that the IUD has come out use a backup birth control method like condoms and call your health care provider. Using tampons will not change the position of the  IUD and are okay to use during your period. This IUD can be safely scanned with magnetic resonance imaging (MRI) only under specific conditions. Before you have an MRI, tell your healthcare provider that you have an IUD in place, and which type of IUD you have in place. What side effects may I notice from receiving this medicine? Side effects that you should report to your doctor or health care professional as soon as possible:  allergic reactions like skin rash, itching or hives, swelling of the face, lips, or tongue  fever, flu-like symptoms  genital sores  high blood pressure  no menstrual period for 6 weeks during use  pain, swelling, warmth in the leg  pelvic pain or tenderness  severe or sudden headache  signs of pregnancy  stomach cramping  sudden shortness of breath  trouble with balance, talking, or walking  unusual vaginal bleeding, discharge  yellowing of the eyes or skin Side effects that usually do not require medical attention (report to your doctor or health care professional if they continue or are bothersome):  acne  breast pain  change in sex drive or performance  changes in weight  cramping, dizziness, or faintness while the device is being inserted  headache  irregular menstrual bleeding within first 3 to 6 months of use  nausea This list may not describe all possible side effects. Call your doctor for medical advice about side effects. You may report side effects to FDA at 1-800-FDA-1088. Where should I keep my medicine? This does not apply. NOTE: This sheet is a summary. It may not cover all possible information. If you have questions about this medicine, talk to your doctor, pharmacist, or health care provider.  2020 Elsevier/Gold Standard (2018-01-23 13:22:01)  

## 2020-03-17 NOTE — Progress Notes (Signed)
GYNECOLOGY ANNUAL PREVENTATIVE CARE ENCOUNTER NOTE  Subjective:   Audrey Cantrell is a 34 y.o. G50P2002 female here for a routine annual gynecologic exam.  Current complaints: acne since starting the Depo Provera..   Denies abnormal vaginal bleeding, discharge, pelvic pain, problems with intercourse or other gynecologic concerns.    Gynecologic History No LMP recorded (approximate). Patient is currently sexually active  Contraception: Depo-Provera injections Last Pap: 03/08/2017. Results were: normal Last mammogram: n/a.  Obstetric History OB History  Gravida Para Term Preterm AB Living  2 2 2     2   SAB IAB Ectopic Multiple Live Births        0 2    # Outcome Date GA Lbr Len/2nd Weight Sex Delivery Anes PTL Lv  2 Term 09/25/17 [redacted]w[redacted]d 02:29 / 00:06 8 lb 3 oz (3.714 kg) M Vag-Spont None  LIV  1 Term 03/22/12     Vag-Spont   LIV    Past Medical History:  Diagnosis Date  . GDM (gestational diabetes mellitus)     Past Surgical History:  Procedure Laterality Date  . DILATION AND CURETTAGE OF UTERUS N/A 09/26/2017   Procedure: DILATATION AND CURETTAGE;  Surgeon: 11/27/2017, MD;  Location: [facility] BIRTHING SU[facility];  Service: Gynecology;  Laterality: N/A;    Current Outpatient Medications on File Prior to Visit  Medication Sig Dispense Refill  . medroxyPROGESTERone (DEPO-PROVERA) 150 MG/ML injection Inject 1 mL (150 mg total) into the muscle every 3 (three) months. 1 mL 4  . ferrous sulfate 325 (65 FE) MG tablet Take 1 tablet (325 mg total) by mouth daily with breakfast. (Patient not taking: No sig reported) 30 tablet 3  . ibuprofen (ADVIL,MOTRIN) 600 MG tablet Take 1 tablet (600 mg total) by mouth every 6 (six) hours. (Patient not taking: No sig reported) 40 tablet 0  . norgestrel-ethinyl estradiol (LO/OVRAL,CRYSELLE) 0.3-30 MG-MCG tablet Take a pill daily IF you are having bleeding. 1 Package 11  . Prenat-FeAsp-Meth-FA-DHA w/o A (PRENATE PIXIE) 10-0.6-0.4-200 MG CAPS Take 1  capsule by mouth daily. (Patient not taking: Reported on 03/17/2020)     No current facility-administered medications on file prior to visit.    No Known Allergies  Social History   Socioeconomic History  . Marital status: Married    Spouse name: Not on file  . Number of children: Not on file  . Years of education: Not on file  . Highest education level: Not on file  Occupational History  . Not on file  Tobacco Use  . Smoking status: Never Smoker  . Smokeless tobacco: Never Used  Substance and Sexual Activity  . Alcohol use: Never  . Drug use: Never  . Sexual activity: Yes  Other Topics Concern  . Not on file  Social History Narrative  . Not on file   Social Determinants of Health   Financial Resource Strain: Not on file  Food Insecurity: Not on file  Transportation Needs: Not on file  Physical Activity: Not on file  Stress: Not on file  Social Connections: Not on file  Intimate Partner Violence: Not on file    History reviewed. No pertinent family history.  The following portions of the patient's history were reviewed and updated as appropriate: allergies, current medications, past family history, past medical history, past social history, past surgical history and problem list.  Review of Systems A comprehensive review of systems was negative.   Objective:  BP 101/69   Pulse (!) 102   Ht 5'  3" (1.6 m)   Wt 143 lb (64.9 kg)   LMP  (Approximate) Comment: DEPO   BMI 25.33 kg/m  Wt Readings from Last 3 Encounters:  03/17/20 143 lb (64.9 kg)  05/11/18 135 lb (61.2 kg)  03/23/18 133 lb 12.8 oz (60.7 kg)     Chaperone present during exam  CONSTITUTIONAL: Well-developed, well-nourished female in no acute distress.  HENT:  Normocephalic, atraumatic, External right and left ear normal. Oropharynx is clear and moist EYES: Conjunctivae and EOM are normal. Pupils are equal, round, and reactive to light. No scleral icterus.  NECK: Normal range of motion, supple,  no masses.  Normal thyroid.   CARDIOVASCULAR: Normal heart rate noted, regular rhythm RESPIRATORY: Clear to auscultation bilaterally. Effort and breath sounds normal, no problems with respiration noted. BREASTS: Symmetric in size. No masses, skin changes, nipple drainage, or lymphadenopathy. ABDOMEN: Soft, normal bowel sounds, no distention noted.  No tenderness, rebound or guarding.  PELVIC: Normal appearing external genitalia; normal appearing vaginal mucosa and cervix.  No abnormal discharge noted.  Normal uterine size, no other palpable masses, no uterine or adnexal tenderness. MUSCULOSKELETAL: Normal range of motion. No tenderness.  No cyanosis, clubbing, or edema.  2+ distal pulses. SKIN: Skin is warm and dry. No rash noted. Not diaphoretic. No erythema. No pallor. NEUROLOGIC: Alert and oriented to person, place, and time. Normal reflexes, muscle tone coordination. No cranial nerve deficit noted. PSYCHIATRIC: Normal mood and affect. Normal behavior. Normal judgment and thought content.  Assessment:  Annual gynecologic examination with pap smear   Plan:  1. Well Woman Exam Will follow up results of pap smear and manage accordingly. Mammogram scheduled STD testing discussed. Patient declined testing Discussed exercise and diet Calcium and Vitamin D supplementation discussed  1. Well woman exam with routine gynecological exam - Will fu on Pap smear results. - Pamphlet given about the Mirena IUD.  Patient is considering.  If desired, will place when she is due for her next Depo (per patient, she is due in January).   Routine preventative health maintenance measures emphasized. Please refer to After Visit Summary for other counseling recommendations.   Darrin Nipper. Gerri Spore, MD [facility] for [facility]

## 2020-03-17 NOTE — Progress Notes (Signed)
Patinet presents for Annual Exam  LMP: Last month before Depo cannot recall date.  Last pap:09/26/2017 Contraception: Depo given at health Clinic per pt  STD Screening: Declines  Family Hx of Breast Cancer: None   CC: None

## 2020-03-19 LAB — CYTOLOGY - PAP
Comment: NEGATIVE
Diagnosis: UNDETERMINED — AB
High risk HPV: NEGATIVE

## 2020-06-09 ENCOUNTER — Ambulatory Visit: Payer: Medicaid Other | Admitting: Family Medicine

## 2020-06-10 ENCOUNTER — Encounter: Payer: Self-pay | Admitting: Women's Health

## 2020-06-10 ENCOUNTER — Ambulatory Visit (INDEPENDENT_AMBULATORY_CARE_PROVIDER_SITE_OTHER): Payer: Medicaid Other | Admitting: Women's Health

## 2020-06-10 ENCOUNTER — Other Ambulatory Visit: Payer: Self-pay

## 2020-06-10 VITALS — BP 111/72 | Ht 63.0 in | Wt 144.0 lb

## 2020-06-10 DIAGNOSIS — Z30011 Encounter for initial prescription of contraceptive pills: Secondary | ICD-10-CM

## 2020-06-10 DIAGNOSIS — L700 Acne vulgaris: Secondary | ICD-10-CM

## 2020-06-10 DIAGNOSIS — Z3009 Encounter for other general counseling and advice on contraception: Secondary | ICD-10-CM

## 2020-06-10 MED ORDER — TRETINOIN 0.025 % EX CREA: TOPICAL_CREAM | Freq: Every day | CUTANEOUS | 3 refills | Status: DC

## 2020-06-10 MED ORDER — NORETHIN ACE-ETH ESTRAD-FE 1-20 MG-MCG PO TABS: 1.0000 | ORAL_TABLET | Freq: Every day | ORAL | 15 refills | Status: DC

## 2020-06-10 NOTE — Patient Instructions (Signed)
When using your birth control, if you experience any of the following, please call the office or report to the nearest emergency room immediately: -severe abdominal pain/weakness -chest pain/shortness of breath -the worst HA you have ever had in your life -sudden changes in vision -difficulty speaking -severe leg pain/redness/swelling Please also refer to the additional information you were given in the office today while using your birth control.       Norethindrone Acetate; Ethinyl Estradiol; Ferrous Fumarate Capsules or Tablets What is this medicine? NORETHINDRONE; ETHINYL ESTRADIOL; FERROUS FUMARATE (nor eth IN drone; ETH in il es tra DYE ole; FER [place] FUE ma rate) is an oral contraceptive. The products combine two types of female hormones, an estrogen and a progestin. These products prevent ovulation and pregnancy. This medicine may be used for other purposes; ask your health care provider or pharmacist if you have questions. COMMON BRAND NAME(S): [place]urovela [address] 1/20, [address], [place] [address], [address] Fe, [place], [place], [address], [address] Fe 1.5/30, [place] Fe 1/20, Hailey 24 Fe, Hailey Fe 1.5/30, Junel Fe 1.5/30, Junel Fe 1/20, Junel Fe 24, [place] Fe, Lo Loestrin Fe, Loestrin 24 Fe, Loestrin FE 1.5/30, Loestrin FE 1/20, Lom[place] 24 Fe, [place], Microgestin 24 Fe, Microgestin Fe 1.5/30, Microgestin Fe 1/20, Tarina 24 Fe, Tarina Fe 1/20, Taysofy, Taytulla, Tilia Fe, Tri-Legest Fe What should I tell my health care provider before I take this medicine? They need to know if you have any of these conditions:  abnormal vaginal bleeding  blood vessel disease or blood clots  breast, cervical, endometrial, ovarian, liver, or uterine cancer  diabetes  gallbladder disease  having surgery  heart disease or recent heart attack  high blood pressure  high cholesterol or triglycerides  history of irregular heartbeat or heart valve problems  kidney disease  liver  disease  migraine headaches  protein C deficiency  protein S deficiency  recently had a baby, miscarriage, or abortion  stroke  systemic lupus erythematosus (SLE)  tobacco smoker  an unusual or allergic reaction to estrogens, progestins, other medicines, foods, dyes, or preservatives  pregnant or trying to get pregnant  breast-feeding How should I use this medicine? Take this medicine by mouth. To reduce nausea, this medicine may be taken with food. Follow the directions on the prescription label. Take this medicine at the same time each day and in the order directed on the package. Do not take your medicine more often than directed. A patient package insert for the product will be given with each prescription and refill. Read this sheet carefully each time. The sheet may change frequently. Contact your pediatrician regarding the use of this medicine in children. Special care may be needed. This medicine has been used in female children who have started having menstrual periods. Overdosage: If you think you have taken too much of this medicine contact a poison control center or emergency room at once. NOTE: This medicine is only for you. Do not share this medicine with others. What if I miss a dose? If you miss a dose, refer to the patient information sheet you received with your medication for direction. If you miss more than one pill, this medication may not be as effective, and you may need to use another form of birth control. What may interact with this medicine? Do not take this medicine with the following medication:  dasabuvir; ombitasvir; paritaprevir; ritonavir  ombitasvir; paritaprevir; ritonavir This medicine may also interact with the following medications:  acetaminophen  antibiotics  or medicines for infections, especially rifampin, rifabutin, rifapentine, and griseofulvin, and possibly penicillins or tetracyclines  aprepitant  ascorbic acid (vitamin  C)  atorvastatin  barbiturate medicines, such as phenobarbital  bosentan  carbamazepine  caffeine  clofibrate  cyclosporine  dantrolene  doxercalciferol  felbamate  grapefruit juice  hydrocortisone  medicines for anxiety or sleeping problems, such as diazepam or temazepam  medicines for diabetes, including pioglitazone  mineral oil  modafinil  mycophenolate  nefazodone  oxcarbazepine  phenytoin  prednisolone  ritonavir or other medicines for HIV infection or AIDS  rosuvastatin  selegiline  soy isoflavones supplements  St. John's wort  tamoxifen or raloxifene  theophylline  thyroid hormones  topiramate  warfarin This list may not describe all possible interactions. Give your health care provider a list of all the medicines, herbs, non-prescription drugs, or dietary supplements you use. Also tell them if you smoke, drink alcohol, or use illegal drugs. Some items may interact with your medicine. What should I watch for while using this medicine? Visit your doctor or health care professional for regular checks on your progress. You will need a regular breast and pelvic exam and Pap smear while on this medicine. Use an additional method of contraception during the first cycle that you take these tablets. If you have any reason to think you are pregnant, stop taking this medicine right away and contact your doctor or health care professional. If you are taking this medicine for hormone related problems, it may take several cycles of use to see improvement in your condition. Smoking increases the risk of getting a blood clot or having a stroke while you are taking birth control pills, especially if you are more than 36 years old. You are strongly advised not to smoke. This medicine can make your body retain fluid, making your fingers, hands, or ankles swell. Your blood pressure can go up. Contact your doctor or health care professional if you feel you  are retaining fluid. This medicine can make you more sensitive to the sun. Keep out of the sun. If you cannot avoid being in the sun, wear protective clothing and use sunscreen. Do not use sun lamps or tanning beds/booths. If you wear contact lenses and notice visual changes, or if the lenses begin to feel uncomfortable, consult your eye care specialist. In some women, tenderness, swelling, or minor bleeding of the gums may occur. Notify your dentist if this happens. Brushing and flossing your teeth regularly may help limit this. See your dentist regularly and inform your dentist of the medicines you are taking. If you are going to have elective surgery, you may need to stop taking this medicine before the surgery. Consult your health care professional for advice. This medicine does not protect you against HIV infection (AIDS) or any other sexually transmitted diseases. What side effects may I notice from receiving this medicine? Side effects that you should report to your doctor or health care professional as soon as possible:  allergic reactions such as skin rash or itching, hives, swelling of the lips, mouth, tongue, or throat  breast tissue changes or discharge  dark patches of skin on your forehead, cheeks, upper lip, and chin  depression  high blood pressure  migraines or severe, sudden headaches  signs and symptoms of a blood clot such as breathing problems; changes in vision; chest pain; severe, sudden headache; pain, swelling, warmth in the leg; trouble speaking; sudden numbness or weakness of the face, arm or leg  stomach pain  symptoms of vaginal infection like itching, irritation or unusual discharge  yellowing of the eyes or skin Side effects that usually do not require medical attention (report these to your doctor or health care professional if they continue or are bothersome):  acne  breast pain, tenderness  irregular vaginal bleeding or spotting, particularly during  the first month of use  mild headache  nausea  weight gain (slight) This list may not describe all possible side effects. Call your doctor for medical advice about side effects. You may report side effects to FDA at 1-800-FDA-1088. Where should I keep my medicine? Keep out of the reach of children. Store at room temperature between 15 and 30 degrees C (59 and 86 degrees F). Throw away any unused medicine after the expiration date. NOTE: This sheet is a summary. It may not cover all possible information. If you have questions about this medicine, talk to your doctor, pharmacist, or health care provider.  2021 Elsevier/Gold Standard (2020-02-03 12:27:45)       Tretinoin skin cream (Acne) What is this medicine? TRETINOIN (TRET i noe in) is a topical retinoid. It is used on the skin to treat acne. This medicine may be used for other purposes; ask your health care provider or pharmacist if you have questions. COMMON BRAND NAME(S): [place], AVITA, Refissa, Retin-A, Tretin-X What should I tell my health care provider before I take this medicine? They need to know if you have any of these conditions:  large areas of burned or damaged skin  an unusual or allergic reaction to tretinoin, other drugs, foods, dyes, or preservatives  pregnant or trying to get pregnant  breast-feeding How should I use this medicine? This medicine is for external use only. Do not take by mouth. Wash your hands before and after use. Do not get it in your eyes. If you do, rinse your eyes with plenty of cool tap water. Use it as directed on the prescription label at the same time every day. Do not use it more often than directed. Do not stop using it unless your health care provider tells you to stop it early. Apply a thin film of the medicine to the affected area. Do not apply to burned or damaged skin. Talk to your health care provider about the use of this medicine in children. While it may be prescribed for  children as young as 12 years for selected conditions, precautions do apply. Overdosage: If you think you have taken too much of this medicine contact a poison control center or emergency room at once. NOTE: This medicine is only for you. Do not share this medicine with others. What if I miss a dose? If you miss a dose, use it as soon as you can. If it is almost time for your next dose, use only that dose. Do not use double or extra doses. What may interact with this medicine?  medicines or other preparations that may dry your skin such as benzoyl peroxide or salicylic acid  medicines that increase your sensitivity to sunlight such as tetracycline or sulfa drugs This list may not describe all possible interactions. Give your health care provider a list of all the medicines, herbs, non-prescription drugs, or dietary supplements you use. Also tell them if you smoke, drink alcohol, or use illegal drugs. Some items may interact with your medicine. What should I watch for while using this medicine? Visit your health care provider for regular checks on your progress. It may be some time before  you see the benefit from this medicine. This medicine can make you more sensitive to the sun. Keep out of the sun, If you cannot avoid being in the sun, wear protective clothing and sunscreen. Do not use sun lamps or tanning beds/booths. Do not use other products that dry the skin. Examples include abrasive cleaners or products with alcohol in them. Do not use other acne products on the same areas of the skin as this one unless your health care provider tells you to use both. What side effects may I notice from receiving this medicine? Side effects that you should report to your doctor or health care provider as soon as possible:  allergic reactions (skin rash, itching or hives; swelling of the face, lips, or tongue)  burning, itching, crusting, peeling of treated skin Side effects that usually do not require  medical attention (report to your doctor or health care provider if they continue or are bothersome):  change in color of skin  mild skin irritation, redness, or dryness This list may not describe all possible side effects. Call your doctor for medical advice about side effects. You may report side effects to FDA at 1-800-FDA-1088. Where should I keep my medicine? Keep out of the reach of children and pets. Store at room temperature between 20 and 25 degrees C (68 and 77 degrees F). Do not freeze. Protect from light and moisture. Keep the container tightly closed. Get rid of any unused medicine after the expiration date. NOTE: This sheet is a summary. It may not cover all possible information. If you have questions about this medicine, talk to your doctor, pharmacist, or health care provider.  2021 Elsevier/Gold Standard (2019-02-25 17:52:31)

## 2020-06-10 NOTE — Progress Notes (Signed)
  History:  Audrey Cantrell is a 35 y.o. [id number] who presents to clinic today for bc and acne consult. Patient reports she was recently put on Depo for birth control, but it caused a severe breakout in acne so she no longer wishes to use this medication for birth control. Patient inquiring about birth control that can help with acne as well as topical medications.  PMH unremarkable. NKDA. No hx of HTN, DM, asthma, blood clots, migraines, liver or heart problems. Patient does not smoke.  The following portions of the patient's history were reviewed and updated as appropriate: allergies, current medications, family history, past medical history, social history, past surgical history and problem list.  Review of Systems:  Review of Systems  Gastrointestinal: Negative for abdominal pain, nausea and vomiting.  Genitourinary: Negative for dysuria, frequency and urgency.     Objective:  Physical Exam BP 111/72 (BP Location: Right Arm, Patient Position: Sitting, Cuff Size: Normal)   Ht 5\' 3"  (1.6 m)   Wt 144 lb (65.3 kg)   LMP 06/03/2020 (Exact Date)   BMI 25.51 kg/m    Physical Exam Vitals and nursing note reviewed.  Constitutional:      Appearance: Normal appearance.  HENT:     Head: Normocephalic and atraumatic.  Pulmonary:     Effort: Pulmonary effort is normal.  Neurological:     Mental Status: She is alert and oriented to person, place, and time.  Psychiatric:        Mood and Affect: Mood normal.        Behavior: Behavior normal.        Thought Content: Thought content normal.        Judgment: Judgment normal.    Labs and Imaging No results found for this or any previous visit (from the past 24 hour(s)).  No results found.   Assessment & Plan:   1. Counseling for birth control, oral contraceptives - pt advised to start pills with first day of next period - discussed administration, side effects, warning signs, time to effectiveness, back-up method PRN, protecting  against STDs. RTO 4-6wks for f/u - norethindrone-ethinyl estradiol (JUNEL FE 1/20) 1-20 MG-MCG tablet; Take 1 tablet by mouth daily.  Dispense: 28 tablet; Refill: 15  2. Acne vulgaris - tretinoin (RETIN-A) 0.025 % cream; Apply topically at bedtime.  Dispense: 45 g; Refill: 3  Approximately 10 minutes of total time was spent with this patient on counseling and coordination of care.  2/20, NP 06/10/2020 4:08 PM

## 2020-06-10 NOTE — Progress Notes (Signed)
Pt here to discuss options regarding BC. Pt states she has been on Depo and Nexplanon in the past. She is having issues with acne and hormonal changes. Pt is open to options.

## 2021-01-04 ENCOUNTER — Other Ambulatory Visit: Payer: Self-pay

## 2021-01-04 ENCOUNTER — Ambulatory Visit (INDEPENDENT_AMBULATORY_CARE_PROVIDER_SITE_OTHER): Payer: Managed Care, Other (non HMO) | Admitting: Family Medicine

## 2021-01-04 ENCOUNTER — Encounter: Payer: Self-pay | Admitting: Family Medicine

## 2021-01-04 VITALS — BP 112/60 | HR 70 | Wt 138.0 lb

## 2021-01-04 DIAGNOSIS — Z1159 Encounter for screening for other viral diseases: Secondary | ICD-10-CM

## 2021-01-04 DIAGNOSIS — Z23 Encounter for immunization: Secondary | ICD-10-CM | POA: Diagnosis not present

## 2021-01-04 DIAGNOSIS — D649 Anemia, unspecified: Secondary | ICD-10-CM | POA: Diagnosis not present

## 2021-01-04 DIAGNOSIS — G4700 Insomnia, unspecified: Secondary | ICD-10-CM

## 2021-01-04 DIAGNOSIS — Z0000 Encounter for general adult medical examination without abnormal findings: Secondary | ICD-10-CM | POA: Diagnosis not present

## 2021-01-04 DIAGNOSIS — R7303 Prediabetes: Secondary | ICD-10-CM | POA: Diagnosis not present

## 2021-01-04 DIAGNOSIS — Z862 Personal history of diseases of the blood and blood-forming organs and certain disorders involving the immune mechanism: Secondary | ICD-10-CM

## 2021-01-04 DIAGNOSIS — R5383 Other fatigue: Secondary | ICD-10-CM

## 2021-01-04 NOTE — Patient Instructions (Signed)
It was great seeing you today!  Thank you for choosing [place] for your PCP needs! Please continue to eat a balanced diet and exercise regularly multiple times a week. We will get blood work today, I will notify you of any abnormal results.   Please follow up at your next scheduled appointment in 1 month, if anything arises between now and then, please don't hesitate to contact our office.   Thank you for allowing [place] to be a part of your medical care!  Thank you, Dr. Robyne Peers

## 2021-01-05 DIAGNOSIS — R7303 Prediabetes: Secondary | ICD-10-CM | POA: Insufficient documentation

## 2021-01-05 DIAGNOSIS — Z862 Personal history of diseases of the blood and blood-forming organs and certain disorders involving the immune mechanism: Secondary | ICD-10-CM | POA: Insufficient documentation

## 2021-01-05 DIAGNOSIS — G4700 Insomnia, unspecified: Secondary | ICD-10-CM | POA: Insufficient documentation

## 2021-01-05 LAB — CBC
Hematocrit: 38 % (ref 34.0–46.6)
Hemoglobin: 12.6 g/dL (ref 11.1–15.9)
MCH: 29 pg (ref 26.6–33.0)
MCHC: 33.2 g/dL (ref 31.5–35.7)
MCV: 88 fL (ref 79–97)
Platelets: 238 10*3/uL (ref 150–450)
RBC: 4.34 x10E6/uL (ref 3.77–5.28)
RDW: 13.3 % (ref 11.7–15.4)
WBC: 7.2 10*3/uL (ref 3.4–10.8)

## 2021-01-05 LAB — BASIC METABOLIC PANEL
BUN/Creatinine Ratio: 17 (ref 9–23)
BUN: 10 mg/dL (ref 6–20)
CO2: 23 mmol/L (ref 20–29)
Calcium: 9.1 mg/dL (ref 8.7–10.2)
Chloride: 103 mmol/L (ref 96–106)
Creatinine, Ser: 0.6 mg/dL (ref 0.57–1.00)
Glucose: 162 mg/dL — ABNORMAL HIGH (ref 70–99)
Potassium: 3.8 mmol/L (ref 3.5–5.2)
Sodium: 141 mmol/L (ref 134–144)
eGFR: 119 mL/min/{1.73_m2} (ref >59)

## 2021-01-05 LAB — TSH: TSH: 2.29 u[IU]/mL (ref 0.450–4.500)

## 2021-01-05 LAB — HCV AB W REFLEX TO QUANT PCR: HCV Ab: <0.1 s/co ratio (ref 0.0–0.9)

## 2021-01-05 LAB — HCV INTERPRETATION

## 2021-01-05 LAB — HEMOGLOBIN A1C
Est. average glucose Bld gHb Est-mCnc: 108 mg/dL
Hgb A1c MFr Bld: 5.4 % (ref 4.8–5.6)

## 2021-01-05 NOTE — Assessment & Plan Note (Signed)
-  extensive sleep hygiene discussed -per patient preference, TSH obtained for fatigue -follow up in 1-2 months, consider melatonin if needed

## 2021-01-05 NOTE — Assessment & Plan Note (Signed)
-  noted during pregnancy -overall seems to be asymptomatic, pending CBC -consider further iron studies if needed to determine if iron supplementation should be started along with encouraging high iron content dietary choices

## 2021-01-05 NOTE — Assessment & Plan Note (Addendum)
-  pending BMP and A1c today given prior medical history and family history  -extensive diet and exercise counseling provided along with reassurance

## 2021-01-05 NOTE — Progress Notes (Signed)
    SUBJECTIVE:   CHIEF COMPLAINT / HPI:   Patient presents to establish care, was previously seen at [facility] in [place] but wanted a PCP switch. Husband and children are at established at [facility]. Past medical history includes gestational diabetes, history of anemia and prediabetes. Only taking minocycline for acne as needed and daily multivitamin. Denies any known drug allergies. No recent surgeries or hospitalizations. Family history significant for diabetes and hyperlipidemia in mother. Endorsing balanced diet and regular exercise. Works at [employer], recently acquired this job which she shares is going well and is very excited about. Denies tobacco, alcohol and illicit drug use. Denies caffeine use. Sexually active with 1 female partner, her husband. Sees OBGYN where she gets routine PAP screening as appropriate and shares she will continue to see them while establishing care at [facility].   Shares that she has been having difficulty with both falling and staying asleep over the past month. Sleeps about 4-5 hours a night, maybe 6-7 hours on a good night. Feels that she often thinks too much before bed. Denies any new or ongoing stressors. Reports that her mood is generally good, has not tried anything for this.   PERTINENT  PMH / PSH:   Prediabetes Reports that she was diagnosed with this last year. Unsure as to what her A1c was at the time. Shares that she works very hard to develop and maintain healthy habits, eat a balanced diet almost daily and exercises regularly. She is very active both at home and at work. Currently not on any medications. She has had gestational diabetes previously during her most recent pregnancy about 3 years ago.  History of anemia Notes anemia history although denies dizziness or weakness. Endorsing occasional fatigue which she attributes to her limited sleep quality. No relatively recent Hgb although 3 years ago was 8.1 during pregnancy.   OBJECTIVE:   BP 112/60   Pulse 70    Wt 138 lb (62.6 kg)   LMP 12/28/2020 (Approximate)   BMI 24.45 kg/m   General: Patient well-appearing, in no acute distress. HEENT: normocephalic, non-tender thyroid, no cervical LAD CV: RRR, no murmurs or gallops auscultated  Resp: CTAB Abdomen: soft, nontender, nondistended, presence of bowel sounds Ext: radial pulses strong and equal bilaterally, no LE edema noted bilaterally Neuro: normal gait Psych: mood appropriate, pleasant   ASSESSMENT/PLAN:   Insomnia -extensive sleep hygiene discussed -per patient preference, TSH obtained for fatigue -follow up in 1-2 months, consider melatonin if needed   History of anemia -noted during pregnancy -overall seems to be asymptomatic, pending CBC -consider further iron studies if needed to determine if iron supplementation should be started along with encouraging high iron content dietary choices   Prediabetes -pending BMP and A1c today given prior medical history and family history  -extensive diet and exercise counseling provided along with reassurance    -Influenza vaccination administered today.  -Pending Hepatitis C screening, will notify patient of any abnormal results   Reece Leader, DO [facility]

## 2021-02-09 ENCOUNTER — Ambulatory Visit: Payer: Medicaid Other | Admitting: Family Medicine

## 2021-02-15 ENCOUNTER — Encounter: Payer: Self-pay | Admitting: Family Medicine

## 2021-02-15 ENCOUNTER — Other Ambulatory Visit: Payer: Self-pay

## 2021-02-15 ENCOUNTER — Ambulatory Visit (INDEPENDENT_AMBULATORY_CARE_PROVIDER_SITE_OTHER): Payer: Managed Care, Other (non HMO) | Admitting: Family Medicine

## 2021-02-15 VITALS — BP 113/77 | HR 90 | Wt 140.2 lb

## 2021-02-15 DIAGNOSIS — G629 Polyneuropathy, unspecified: Secondary | ICD-10-CM

## 2021-02-15 DIAGNOSIS — L709 Acne, unspecified: Secondary | ICD-10-CM | POA: Insufficient documentation

## 2021-02-15 DIAGNOSIS — G5793 Unspecified mononeuropathy of bilateral lower limbs: Secondary | ICD-10-CM | POA: Diagnosis not present

## 2021-02-15 DIAGNOSIS — L700 Acne vulgaris: Secondary | ICD-10-CM

## 2021-02-15 NOTE — Patient Instructions (Signed)
We will follow up with you once the results are available and discuss if you would like to trial gabapentin for bed time.   Your hemoglobin A1c from your last visit was normal and did not indicate diabetes.   Please follow up in 2 weeks to check on your feet symptoms.   Neuropathic Pain Neuropathic pain is pain caused by damage to the nerves that are responsible for certain sensations in your body (sensory nerves). Neuropathic pain can make you more sensitive to pain. Even a minor sensation can feel very painful. This is usually a long-term (chronic) condition that can be difficult to treat. The type of pain differs from person to person. It may: Start suddenly (acute), or it may develop slowly and become chronic. Come and go as damaged nerves heal, or it may stay at the same level for years. Cause emotional distress, loss of sleep, and a lower quality of life. What are the causes? The most common cause of this condition is diabetes. Many other diseases and conditions can also cause neuropathic pain. Causes of neuropathic pain can be classified as: Toxic. This is caused by medicines and chemicals. The most common causes of toxic neuropathic pain is damage from medicines that kill cancer cells (chemotherapy) or alcohol abuse. Metabolic. This can be caused by: Diabetes. Lack of vitamins like B12. Traumatic. Any injury that cuts, crushes, or stretches a nerve can cause damage and pain. Compression-related. If a sensory nerve gets trapped or compressed for a long period of time, the blood supply to the nerve can be cut off. Vascular. Many blood vessel diseases can cause neuropathic pain by decreasing blood supply and oxygen to nerves. Autoimmune. This type of pain results from diseases in which the body's defense system (immune system) mistakenly attacks sensory nerves. Examples of autoimmune diseases that can cause neuropathic pain include lupus and multiple sclerosis. Infectious. Many types of  viral infections can damage sensory nerves and cause pain. Shingles infection is a common cause of this type of pain. Inherited. Neuropathic pain can be a symptom of many diseases that are passed down through families (genetic). What increases the risk? You are more likely to develop this condition if: You have diabetes. You smoke. You drink too much alcohol. You are taking certain medicines, including chemotherapy or medicines that treat immune system disorders. What are the signs or symptoms? The main symptom is pain. Neuropathic pain is often described as: Burning. Shock-like. Stinging. Hot or cold. Itching. How is this diagnosed? No single test can diagnose neuropathic pain. It is diagnosed based on: A physical exam and your symptoms. Your health care provider will ask you about your pain. You may be asked to use a pain scale to describe how bad your pain is. Tests. These may be done to see if you have a cause and location of any nerve damage. They include: Nerve conduction studies and electromyography to test how well nerve signals travel through your nerves and muscles (electrodiagnostic testing). Skin biopsy to evaluate for small fiber neuropathy. Imaging studies, such as: X-rays. CT scan. MRI. How is this treated? Treatment for neuropathic pain may change over time. You may need to try different treatment options or a combination of treatments. Some options include: Treating the underlying cause of the neuropathy, such as diabetes, kidney disease, or vitamin deficiencies. Stopping medicines that can cause neuropathy, such as chemotherapy. Medicine to relieve pain. Medicines may include: Prescription or over-the-counter pain medicine. Anti-seizure medicine. Antidepressant medicines. Pain-relieving patches or creams that  are applied to painful areas of skin. A medicine to numb the area (local anesthetic), which can be injected as a nerve block. Transcutaneous nerve  stimulation. This uses electrical currents to block painful nerve signals. The treatment is painless. Alternative treatments, such as: Acupuncture. Meditation. Massage. Occupational or physical therapy. Pain management programs. Counseling. Follow these instructions at home: Medicines  Take over-the-counter and prescription medicines only as told by your health care provider. Ask your health care provider if the medicine prescribed to you: Requires you to avoid driving or using machinery. Can cause constipation. You may need to take these actions to prevent or treat constipation: Drink enough fluid to keep your urine pale yellow. Take over-the-counter or prescription medicines. Eat foods that are high in fiber, such as beans, whole grains, and fresh fruits and vegetables. Limit foods that are high in fat and processed sugars, such as fried or sweet foods. Lifestyle  Have a good support system at home. Consider joining a chronic pain support group. Do not use any products that contain nicotine or tobacco. These products include cigarettes, chewing tobacco, and vaping devices, such as e-cigarettes. If you need help quitting, ask your health care provider. Do not drink alcohol. General instructions Learn as much as you can about your condition. Work closely with all your health care providers to find the treatment plan that works best for you. Ask your health care provider what activities are safe for you. Keep all follow-up visits. This is important. Contact a health care provider if: Your pain treatments are not working. You are having side effects from your medicines. You are struggling with tiredness (fatigue), mood changes, depression, or anxiety. Get help right away if: You have thoughts of hurting yourself. Get help right away if you feel like you may hurt yourself or others, or have thoughts about taking your own life. Go to your nearest emergency room or: Call 911. Call  the National Suicide Prevention Lifeline at (520)235-3028 or 988. This is open 24 hours a day. Text the Crisis Text Line at (406) 209-2397. Summary Neuropathic pain is pain caused by damage to the nerves that are responsible for certain sensations in your body (sensory nerves). Neuropathic pain may come and go as damaged nerves heal, or it may stay at the same level for years. Neuropathic pain is usually a long-term condition that can be difficult to treat. Consider joining a chronic pain support group. This information is not intended to replace advice given to you by your health care provider. Make sure you discuss any questions you have with your health care provider. Document Revised: 11/09/2020 Document Reviewed: 11/09/2020 Elsevier Patient Education  2022 [employer].

## 2021-02-15 NOTE — Assessment & Plan Note (Signed)
Patient previously checked for hemoglobin A1c, within normal range at 5.4.  We will check TSH, CBC, vitamin B12 and magnesium.  Patient offered 100 mg bedtime dose of gabapentin for symptoms while awaiting lab results.  Patient declines at this time stating that she would prefer to know the cause of her symptoms prior to starting new medication and she does not like taking medications.  We will follow-up with results once they are available and determine if patient would like to try gabapentin at that time.  No physical exam findings to increase suspicion for injury, we will not obtain x-rays at this time or any further imaging.

## 2021-02-15 NOTE — Assessment & Plan Note (Addendum)
Referral for dermatology submitted for evaluation of acne despite abx after end of visit. Patient encouraged to follow up with physician who prescribed retinoid treatment for further refills

## 2021-02-15 NOTE — Progress Notes (Signed)
SUBJECTIVE:   CHIEF COMPLAINT / HPI: f/u for labs and foot burning sensation    Foot burning sensation Patient states that for the last 4 years since her last pregnancy, she has been having nighttime burning sensation in her bilateral feet.  She states that the pain is somewhat alleviated by placing her feet in cool water for 15 minutes.  She reports that the pain prevents her from being able to sleep.  She states that during her pregnancy, she remembers taking gabapentin but does not remember while she was taking it or if it was effective at relieving her symptoms in her feet.  She denies any previous injuries or surgeries to her feet.  She denies any skin changes or lacerations to her feet.  She denies any numbness in her feet.  She reports that her feet just feel very warm.  Patient was tested for diabetes previously and noted to have hemoglobin A1c within normal range at 5.4.  These results were reviewed today per patient request.  Patient denies any abnormalities with ambulation due to the running sensation in her feet.  She denies that the burning sensation travels any higher than her feet.  Acne  After completing visit and giving AVS, patient mentions a request for dermatology referral. Patient states that she would like to be referred to dermatology for acne that persists despite taking minocycline for year. She reports that she initially thought this was a side effect of depo provera injections but states that since stopping the depo  shots, she continues to have acne. She would like to speak with a specialist regarding other options as she does not want to depend on the medication to clear her skin.    PERTINENT  PMH / PSH:  Hx of Anemia  Gestational DM    OBJECTIVE:   BP 113/77   Pulse 90   Wt 140 lb 3.2 oz (63.6 kg)   SpO2 97%   BMI 24.84 kg/m   Physical Exam Cardiovascular:     Pulses:          Dorsalis pedis pulses are 2+ on the right side and 2+ on the left side.        Posterior tibial pulses are 2+ on the right side and 2+ on the left side.  Musculoskeletal:     Right foot: Normal range of motion. No deformity, bunion or Charcot foot.     Left foot: Normal range of motion. No deformity, bunion or Charcot foot.  Feet:     Right foot:     Skin integrity: Skin integrity normal. No ulcer, blister, skin breakdown, erythema or warmth.     Toenail Condition: Right toenails are normal.     Left foot:     Skin integrity: Skin integrity normal. No ulcer, blister, skin breakdown, erythema or warmth.     Toenail Condition: Left toenails are normal.   ASSESSMENT/PLAN:   Neuropathic pain of both feet Patient previously checked for hemoglobin A1c, within normal range at 5.4.  We will check TSH, CBC, vitamin B12 and magnesium.  Patient offered 100 mg bedtime dose of gabapentin for symptoms while awaiting lab results.  Patient declines at this time stating that she would prefer to know the cause of her symptoms prior to starting new medication and she does not like taking medications.  We will follow-up with results once they are available and determine if patient would like to try gabapentin at that time.  No physical exam findings  to increase suspicion for injury, we will not obtain x-rays at this time or any further imaging.  Acne Referral for dermatology submitted for evaluation of acne despite abx after end of visit. Patient encouraged to follow up with physician who prescribed retinoid treatment for further refills     Ronnald Ramp, MD [facility]

## 2021-02-16 LAB — CBC
Hematocrit: 38.9 % (ref 34.0–46.6)
Hemoglobin: 12.9 g/dL (ref 11.1–15.9)
MCH: 29.5 pg (ref 26.6–33.0)
MCHC: 33.2 g/dL (ref 31.5–35.7)
MCV: 89 fL (ref 79–97)
Platelets: 185 10*3/uL (ref 150–450)
RBC: 4.38 x10E6/uL (ref 3.77–5.28)
RDW: 13.1 % (ref 11.7–15.4)
WBC: 5.9 10*3/uL (ref 3.4–10.8)

## 2021-02-16 LAB — VITAMIN B12: Vitamin B-12: 762 pg/mL (ref 232–1245)

## 2021-02-16 LAB — MAGNESIUM: Magnesium: 2 mg/dL (ref 1.6–2.3)

## 2021-02-16 LAB — TSH: TSH: 2.7 u[IU]/mL (ref 0.450–4.500)

## 2021-02-23 ENCOUNTER — Other Ambulatory Visit: Payer: Self-pay | Admitting: Family Medicine

## 2021-02-23 MED ORDER — GABAPENTIN 100 MG PO CAPS: 100.0000 mg | ORAL_CAPSULE | Freq: Every day | ORAL | 0 refills | Status: DC

## 2021-04-10 ENCOUNTER — Encounter: Payer: Self-pay | Admitting: Family Medicine

## 2021-06-16 ENCOUNTER — Other Ambulatory Visit: Payer: Self-pay

## 2021-06-16 ENCOUNTER — Ambulatory Visit (INDEPENDENT_AMBULATORY_CARE_PROVIDER_SITE_OTHER): Payer: Managed Care, Other (non HMO) | Admitting: Emergency Medicine

## 2021-06-16 VITALS — BP 113/71 | HR 83 | Ht 63.0 in | Wt 140.7 lb

## 2021-06-16 DIAGNOSIS — Z3201 Encounter for pregnancy test, result positive: Secondary | ICD-10-CM

## 2021-06-16 DIAGNOSIS — Z3200 Encounter for pregnancy test, result unknown: Secondary | ICD-10-CM

## 2021-06-16 LAB — POCT URINE PREGNANCY: Preg Test, Ur: POSITIVE — AB

## 2021-06-16 NOTE — Progress Notes (Signed)
Ms. Munroe presents today for UPT. She has no unusual complaints. ?LMP: 05/07/21 ?   ?OBJECTIVE: Appears well, in no apparent distress.  ?OB History   ? ? Gravida  ?3  ? Para  ?2  ? Term  ?2  ? Preterm  ?   ? AB  ?   ? Living  ?2  ?  ? ? SAB  ?   ? IAB  ?   ? Ectopic  ?   ? Multiple  ?0  ? Live Births  ?2  ?   ?  ?  ? ?Home UPT Result: Positive ?In-Office UPT result:Positive -test ran twice, with 2nd RN to confirm results. Very faint line. ? ? ? I have reviewed the patient's medical, obstetrical, social, and family histories, and medications.  ? ?ASSESSMENT: Positive pregnancy test- Test ran twice, with 2nd RN to confirm results. Very faint line. ? ?PLAN ?Prenatal care to be completed at: [facility] ?  ?

## 2021-06-23 ENCOUNTER — Encounter (HOSPITAL_COMMUNITY): Payer: Self-pay | Admitting: Obstetrics and Gynecology

## 2021-06-23 ENCOUNTER — Inpatient Hospital Stay (HOSPITAL_COMMUNITY): Payer: Medicaid Other

## 2021-06-23 ENCOUNTER — Inpatient Hospital Stay (HOSPITAL_COMMUNITY)
Admission: AD | Admit: 2021-06-23 | Discharge: 2021-06-23 | Disposition: A | Discharge status: Home / Self Care | Payer: Medicaid Other | Attending: Obstetrics and Gynecology | Admitting: Obstetrics and Gynecology

## 2021-06-23 DIAGNOSIS — O209 Hemorrhage in early pregnancy, unspecified: Secondary | ICD-10-CM | POA: Diagnosis present

## 2021-06-23 DIAGNOSIS — O26891 Other specified pregnancy related conditions, first trimester: Secondary | ICD-10-CM | POA: Diagnosis not present

## 2021-06-23 DIAGNOSIS — O3680X0 Pregnancy with inconclusive fetal viability, not applicable or unspecified: Secondary | ICD-10-CM | POA: Diagnosis not present

## 2021-06-23 DIAGNOSIS — R102 Pelvic and perineal pain: Secondary | ICD-10-CM | POA: Insufficient documentation

## 2021-06-23 DIAGNOSIS — Z6790 Unspecified blood type, Rh positive: Secondary | ICD-10-CM | POA: Diagnosis not present

## 2021-06-23 DIAGNOSIS — Z3A01 Less than 8 weeks gestation of pregnancy: Secondary | ICD-10-CM | POA: Diagnosis not present

## 2021-06-23 LAB — CBC
HCT: 42 % (ref 36.0–46.0)
Hemoglobin: 14 g/dL (ref 12.0–15.0)
MCH: 30.4 pg (ref 26.0–34.0)
MCHC: 33.3 g/dL (ref 30.0–36.0)
MCV: 91.3 fL (ref 80.0–100.0)
Platelets: 208 10*3/uL (ref 150–400)
RBC: 4.6 MIL/uL (ref 3.87–5.11)
RDW: 12.2 % (ref 11.5–15.5)
WBC: 8.1 10*3/uL (ref 4.0–10.5)
nRBC: 0 % (ref 0.0–0.2)

## 2021-06-23 LAB — URINALYSIS, MICROSCOPIC (REFLEX)

## 2021-06-23 LAB — URINALYSIS, ROUTINE W REFLEX MICROSCOPIC
Bilirubin Urine: NEGATIVE
Glucose, UA: NEGATIVE mg/dL
Ketones, ur: NEGATIVE mg/dL
Leukocytes,Ua: NEGATIVE
Nitrite: NEGATIVE
Protein, ur: NEGATIVE mg/dL
Specific Gravity, Urine: 1.01 (ref 1.005–1.030)
pH: 6 (ref 5.0–8.0)

## 2021-06-23 LAB — HCG, QUANTITATIVE, PREGNANCY: hCG, Beta Chain, Quant, S: 986 m[IU]/mL — ABNORMAL HIGH (ref <5)

## 2021-06-23 NOTE — MAU Note (Signed)
Audrey Cantrell is a 36 y.o. at [redacted]w[redacted]d here in MAU reporting: is pregnant. Been having bleeding since Sunday, has gotten heavier and passed clots.  Having pain, cramping in lower abd and back- started Sun night.  ?Onset of complaint: Sunday ?Pain score: 6 ?Vitals:  ? 06/23/21 1053  ?BP: 106/71  ?Pulse: 73  ?Resp: 18  ?Temp: 98.4 ?F (36.9 ?C)  ?SpO2: 100%  ?   ? ?Lab orders placed from triage:  urine ? ?

## 2021-06-23 NOTE — MAU Provider Note (Signed)
?History  ? ?CSN: [id number] ? ?Arrival date and time: 06/23/21 1038 ? ?Event Date/Time  ?First Provider Initiated Contact with Patient 06/23/21 1105   ?  ?Chief Complaint  ?Patient presents with  ? Vaginal Bleeding  ? Abdominal Pain  ? Back Pain  ? ?HPI ?Audrey Cantrell is a 36 y.o. G3P2002 at 2747w5d who presents to MAU with chief complaint of vaginal bleeding. This is a recurrent problem, onset three days ago. Patient states bleeding is "really heavy".  She is saturating pads in about 3-4 hours. She denies dizziness, weakness, syncope. ? ?Patient also c/p generalized lower abdominal pain, onset coincides with onset of bleeding. Pain score 6/10. Pain does not radiate. She has not taken medication for this complaint. She denies dysuria, abdominal tenderness, fever. She declines pain medication throughout MAU evaluation. ? ? ?OB History   ? ? Gravida  ?3  ? Para  ?2  ? Term  ?2  ? Preterm  ?   ? AB  ?   ? Living  ?2  ?  ? ? SAB  ?   ? IAB  ?   ? Ectopic  ?   ? Multiple  ?0  ? Live Births  ?2  ?   ?  ?  ? ? ?Past Medical History:  ?Diagnosis Date  ? GDM (gestational diabetes mellitus)   ? ? ?Past Surgical History:  ?Procedure Laterality Date  ? DILATION AND CURETTAGE OF UTERUS N/A 09/26/2017  ? Procedure: DILATATION AND CURETTAGE;  Surgeon: Levi AlandAnderson, Mark E, MD;  [facility];  Service: Gynecology;  Laterality: N/A;  ? ? ?No family history on file. ? ?Social History  ? ?Tobacco Use  ? Smoking status: Never  ? Smokeless tobacco: Never  ?Substance Use Topics  ? Alcohol use: Never  ? Drug use: Never  ? ? ?Allergies: No Known Allergies ? ?Medications Prior to Admission  ?Medication Sig Dispense Refill Last Dose  ? gabapentin (NEURONTIN) 100 MG capsule Take 1 capsule (100 mg total) by mouth at bedtime. (Patient not taking: Reported on 06/16/2021) 90 capsule 0   ? minocycline (MINOCIN) 50 MG capsule Take 50 mg by mouth 2 (two) times daily. (Patient not taking: Reported on 06/16/2021)     ? norethindrone-ethinyl  estradiol (JUNEL FE 1/20) 1-20 MG-MCG tablet Take 1 tablet by mouth daily. (Patient not taking: Reported on 01/04/2021) 28 tablet 15   ? Prenatal Vit-Fe Fumarate-FA (MULTIVITAMIN-PRENATAL) 27-0.8 MG TABS tablet Take 1 tablet by mouth daily at 12 noon.     ? tretinoin (RETIN-A) 0.025 % cream Apply topically at bedtime. (Patient not taking: Reported on 01/04/2021) 45 g 3   ? ? ?Review of Systems  ?Gastrointestinal:  Positive for abdominal pain.  ?Genitourinary:  Positive for vaginal bleeding.  ?All other systems reviewed and are negative. ?Physical Exam  ? ?Blood pressure 106/71, pulse 73, temperature 98.4 ?F (36.9 ?C), resp. rate 18, height 5\' 3"  (1.6 m), weight 64 kg, last menstrual period 05/07/2021, SpO2 100 %. ? ?Physical Exam ?Vitals and nursing note reviewed. Exam conducted with a chaperone present.  ?Constitutional:   ?   Appearance: She is well-developed.  ?Cardiovascular:  ?   Rate and Rhythm: Normal rate and regular rhythm.  ?   Heart sounds: Normal heart sounds.  ?Pulmonary:  ?   Effort: Pulmonary effort is normal.  ?   Breath sounds: Normal breath sounds.  ?Abdominal:  ?   General: Abdomen is flat.  ?   Palpations: Abdomen is soft.  ?  Tenderness: There is no abdominal tenderness.  ?Genitourinary: ?   Comments: Pelvic exam: External genitalia normal, vaginal walls pink and well rugated, cervix visually closed, no lesions noted. Scant dark red blood in posterior vault. Removed with fox swab x 2. ? ? ?Neurological:  ?   Mental Status: She is alert and oriented to person, place, and time.  ?Psychiatric:     ?   Mood and Affect: Mood normal.     ?   Behavior: Behavior normal.  ? ? ?MAU Course  ?Procedures ? ?MDM ?Orders Placed This Encounter  ?Procedures  ? Urinalysis, Routine w reflex microscopic Urine, Clean Catch  ? CBC  ? hCG, quantitative, pregnancy  ? ?Patient Vitals for the past 24 hrs: ? BP Temp Temp src Pulse Resp SpO2 Height Weight  ?06/23/21 1346 107/72 97.8 ?F (36.6 ?C) Oral 67 20 100 % -- --   ?06/23/21 1053 106/71 98.4 ?F (36.9 ?C) -- 73 18 100 % 5\' 3"  (1.6 m) 64 kg  ? ?Results for orders placed or performed during the hospital encounter of 06/23/21 (from the past 24 hour(s))  ?CBC     Status: None  ? Collection Time: 06/23/21 11:08 AM  ?Result Value Ref Range  ? WBC 8.1 4.0 - 10.5 K/uL  ? RBC 4.60 3.87 - 5.11 MIL/uL  ? Hemoglobin 14.0 12.0 - 15.0 g/dL  ? HCT 42.0 36.0 - 46.0 %  ? MCV 91.3 80.0 - 100.0 fL  ? MCH 30.4 26.0 - 34.0 pg  ? MCHC 33.3 30.0 - 36.0 g/dL  ? RDW 12.2 11.5 - 15.5 %  ? Platelets 208 150 - 400 K/uL  ? nRBC 0.0 0.0 - 0.2 %  ?hCG, quantitative, pregnancy     Status: Abnormal  ? Collection Time: 06/23/21 11:08 AM  ?Result Value Ref Range  ? hCG, Beta Chain, Quant, S 986 (H) <5 mIU/mL  ?Urinalysis, Routine w reflex microscopic Urine, Clean Catch     Status: Abnormal  ? Collection Time: 06/23/21 11:38 AM  ?Result Value Ref Range  ? Color, Urine YELLOW YELLOW  ? APPearance CLEAR CLEAR  ? Specific Gravity, Urine 1.010 1.005 - 1.030  ? pH 6.0 5.0 - 8.0  ? Glucose, UA NEGATIVE NEGATIVE mg/dL  ? Hgb urine dipstick LARGE (A) NEGATIVE  ? Bilirubin Urine NEGATIVE NEGATIVE  ? Ketones, ur NEGATIVE NEGATIVE mg/dL  ? Protein, ur NEGATIVE NEGATIVE mg/dL  ? Nitrite NEGATIVE NEGATIVE  ? Leukocytes,Ua NEGATIVE NEGATIVE  ?Urinalysis, Microscopic (reflex)     Status: Abnormal  ? Collection Time: 06/23/21 11:38 AM  ?Result Value Ref Range  ? RBC / HPF 6-10 0 - 5 RBC/hpf  ? WBC, UA 0-5 0 - 5 WBC/hpf  ? Bacteria, UA RARE (A) NONE SEEN  ? Squamous Epithelial / LPF 0-5 0 - 5  ? Mucus PRESENT   ? ?06/25/21 OB LESS THAN 14 WEEKS WITH OB TRANSVAGINAL ? ?Result Date: 06/23/2021 ?CLINICAL DATA:  Early pregnancy.  Bleeding.  Beta HCG of 986. EXAM: OBSTETRIC <14 WK 06/25/2021 AND TRANSVAGINAL OB US TECHNIQUE: Both transabdominal and transvaginal ultrasound examinations were performed for complete evaluation of the gestation as well as the maternal uterus, adnexal regions, and pelvic cul-de-sac. Transvaginal technique was performed  to assess early pregnancy. COMPARISON:  None. FINDINGS: Intrauterine gestational sac: Absent Yolk sac:  Absent Embryo:  Absent Cardiac Activity: Absent Subchorionic hemorrhage:  None visualized. Maternal uterus/adnexae: Probable right ovarian corpus luteal cyst, including at 1.1 cm on image 102. Trace pelvic fluid. IMPRESSION:  Lack of intrauterine gestational sac, yolk sac, or fetal pole. Given beta HCG level, differential considerations include early intrauterine pregnancy, ectopic pregnancy, or missed abortion. Recommend beta HCG surveillance and possibly ultrasound follow-up. Probable right ovarian corpus luteal cyst. Electronically Signed   By: Jeronimo Greaves M.D.   On: 06/23/2021 13:15   ? ? ?Assessment and Plan  ?--36 y.o. G3P2002 at [redacted]w[redacted]d by LMP ?--Vaginal bleeding in the setting of pregnancy of unknown location ?--Quant hCG 986 ?--Hgb 14.0 ?--Blood type O POS ?--Discharge home in stable condition with bleeding precautions ? ?F/U: ?--Return to MAU in 48 hours for Stat Quant hCG (Friday 06/25/2021 at or after 11am) .  ?--Pt states she may be unable to return before Saturday due to transportation difficulties ? ?Calvert Cantor, MSA, MSN, CNM ?06/23/2021, 2:55 PM  ?

## 2021-06-24 ENCOUNTER — Encounter: Payer: Self-pay | Admitting: *Deleted

## 2021-06-26 ENCOUNTER — Other Ambulatory Visit: Payer: Self-pay

## 2021-06-26 ENCOUNTER — Inpatient Hospital Stay (HOSPITAL_COMMUNITY)
Admission: AD | Admit: 2021-06-26 | Discharge: 2021-06-26 | Disposition: A | Discharge status: Home / Self Care | Payer: Medicaid Other | Attending: Obstetrics & Gynecology | Admitting: Obstetrics & Gynecology

## 2021-06-26 ENCOUNTER — Inpatient Hospital Stay (HOSPITAL_COMMUNITY)
Admit: 2021-06-26 | Discharge: 2021-06-26 | Disposition: A | Discharge status: Home / Self Care | Payer: Medicaid Other | Attending: Obstetrics and Gynecology | Admitting: Obstetrics and Gynecology

## 2021-06-26 DIAGNOSIS — O209 Hemorrhage in early pregnancy, unspecified: Secondary | ICD-10-CM

## 2021-06-26 DIAGNOSIS — O0281 Inappropriate change in quantitative human chorionic gonadotropin (hCG) in early pregnancy: Secondary | ICD-10-CM | POA: Insufficient documentation

## 2021-06-26 LAB — HCG, QUANTITATIVE, PREGNANCY: hCG, Beta Chain, Quant, S: 164 m[IU]/mL — ABNORMAL HIGH (ref <5)

## 2021-06-26 NOTE — MAU Provider Note (Signed)
Event Date/Time  ?First Provider Initiated Contact with Patient 06/26/21 1048   ?  ?S ?Ms. Audrey Cantrell is a 36 y.o. [id number] patient who presents to MAU today for repeat stat Quant hCG. Patient states she has continued to experience scant to small vaginal bleeding. She is not saturating pads or experiencing dizziness, weakness, syncope. She denies abdominal pain. ? ?O ?BP 102/69 (BP Location: Right Arm)   Pulse 84   Temp 98.9 ?F (37.2 ?C) (Oral)   Resp 16   LMP 05/07/2021   SpO2 97% Comment: room air  ? ?Physical Exam ?Vitals and nursing note reviewed. Exam conducted with a chaperone present.  ?Constitutional:   ?   General: She is not in acute distress. ?   Appearance: Normal appearance. She is not ill-appearing.  ?Cardiovascular:  ?   Rate and Rhythm: Normal rate.  ?   Pulses: Normal pulses.  ?Pulmonary:  ?   Effort: Pulmonary effort is normal.  ?Skin: ?   Capillary Refill: Capillary refill takes less than 2 seconds.  ?Neurological:  ?   Mental Status: She is alert and oriented to person, place, and time.  ?Psychiatric:     ?   Behavior: Behavior normal.     ?   Thought Content: Thought content normal.     ?   Judgment: Judgment normal.  ? ?A ?-Medical screening exam complete ?-Blood type O POS ?-Significant decrease in Quant hCG c/w miscarriage ?-Patient discharged home, called with results at 1130 ? ?Component ?    Latest Ref Rng 06/23/2021 06/26/2021  ?HCG, Beta Chain, Quant, S ?    <5 mIU/mL 986 (H)  164 (H)   ?  ?P ?-Discharge from MAU in stable condition with bleeding precautions ?-Warning signs for worsening condition that would warrant emergency follow-up discussed ?-Patient may return to MAU as needed  ? ?F/U: ?One week non-stat Quant hCG at [facility] ? ?Clayton Bibles, CNM ?06/26/2021 1:10 PM ?

## 2021-06-26 NOTE — MAU Note (Signed)
Audrey Cantrell is a 36 y.o. at [redacted]w[redacted]d here in MAU reporting: here for follow up hcg. Denies pain. Still having some bleeding but states it is lighter than previous visit. No blood clots. ? ?Onset of complaint: ongoing ? ?Pain score: 0/10 ? ?Vitals:  ? 06/26/21 1006  ?BP: 102/69  ?Pulse: 84  ?Resp: 16  ?Temp: 98.9 ?F (37.2 ?C)  ?SpO2: 97%  ?   ?Lab orders placed from triage: hcg ? ?

## 2021-06-30 ENCOUNTER — Telehealth: Payer: Self-pay | Admitting: Emergency Medicine

## 2021-06-30 NOTE — Telephone Encounter (Signed)
RC to patient in response to MyChart message. Pt states that staff from ED called her and answered all her questions. She has no further questions or concerns.  ?

## 2021-07-05 ENCOUNTER — Other Ambulatory Visit: Payer: Medicaid Other

## 2021-07-05 DIAGNOSIS — O209 Hemorrhage in early pregnancy, unspecified: Secondary | ICD-10-CM

## 2021-07-06 LAB — BETA HCG QUANT (REF LAB): hCG Quant: 5 m[IU]/mL

## 2021-07-07 NOTE — Progress Notes (Signed)
Please inform patient of complete resolution of pregnancy. No further quant HCG needed. Offer family planning appointment for contraception counseling if desired and update pap smear if needed

## 2021-07-29 ENCOUNTER — Encounter: Payer: Managed Care, Other (non HMO) | Admitting: Obstetrics and Gynecology

## 2021-08-10 ENCOUNTER — Encounter: Payer: Managed Care, Other (non HMO) | Admitting: Family Medicine

## 2021-08-11 ENCOUNTER — Other Ambulatory Visit: Payer: Self-pay | Admitting: Internal Medicine

## 2021-08-12 LAB — COMPLETE METABOLIC PANEL WITH GFR
AG Ratio: 1.6 (calc) (ref 1.0–2.5)
ALT: 12 U/L (ref 6–29)
AST: 15 U/L (ref 10–30)
Albumin: 4.4 g/dL (ref 3.6–5.1)
Alkaline phosphatase (APISO): 48 U/L (ref 31–125)
BUN: 7 mg/dL (ref 7–25)
CO2: 25 mmol/L (ref 20–32)
Calcium: 9.2 mg/dL (ref 8.6–10.2)
Chloride: 105 mmol/L (ref 98–110)
Creat: 0.54 mg/dL (ref 0.50–0.97)
Globulin: 2.7 g/dL (calc) (ref 1.9–3.7)
Glucose, Bld: 80 mg/dL (ref 65–99)
Potassium: 3.7 mmol/L (ref 3.5–5.3)
Sodium: 139 mmol/L (ref 135–146)
Total Bilirubin: 1.3 mg/dL — ABNORMAL HIGH (ref 0.2–1.2)
Total Protein: 7.1 g/dL (ref 6.1–8.1)
eGFR: 122 mL/min/{1.73_m2} (ref >=60)

## 2021-08-12 LAB — CBC
HCT: 40.2 % (ref 35.0–45.0)
Hemoglobin: 13.1 g/dL (ref 11.7–15.5)
MCH: 29.6 pg (ref 27.0–33.0)
MCHC: 32.6 g/dL (ref 32.0–36.0)
MCV: 90.7 fL (ref 80.0–100.0)
MPV: 13.4 fL — ABNORMAL HIGH (ref 7.5–12.5)
Platelets: 207 10*3/uL (ref 140–400)
RBC: 4.43 10*6/uL (ref 3.80–5.10)
RDW: 12 % (ref 11.0–15.0)
WBC: 8.4 10*3/uL (ref 3.8–10.8)

## 2021-08-12 LAB — VITAMIN D 25 HYDROXY (VIT D DEFICIENCY, FRACTURES): Vit D, 25-Hydroxy: 33 ng/mL (ref 30–100)

## 2021-08-12 LAB — LIPID PANEL
Cholesterol: 121 mg/dL (ref <200)
HDL: 61 mg/dL (ref >=50)
LDL Cholesterol (Calc): 46 mg/dL (calc)
Non-HDL Cholesterol (Calc): 60 mg/dL (calc) (ref <130)
Total CHOL/HDL Ratio: 2 (calc) (ref <5.0)
Triglycerides: 65 mg/dL (ref <150)

## 2021-08-31 ENCOUNTER — Encounter: Payer: Self-pay | Admitting: *Deleted

## 2021-09-21 ENCOUNTER — Ambulatory Visit (INDEPENDENT_AMBULATORY_CARE_PROVIDER_SITE_OTHER): Payer: Medicaid Other

## 2021-09-21 VITALS — BP 108/66 | HR 88 | Ht 63.0 in | Wt 141.6 lb

## 2021-09-21 DIAGNOSIS — Z3480 Encounter for supervision of other normal pregnancy, unspecified trimester: Secondary | ICD-10-CM | POA: Diagnosis not present

## 2021-09-21 DIAGNOSIS — O3680X0 Pregnancy with inconclusive fetal viability, not applicable or unspecified: Secondary | ICD-10-CM

## 2021-09-21 MED ORDER — BLOOD PRESSURE KIT DEVI: 1.0000 | 0 refills | Status: DC

## 2021-09-21 MED ORDER — GOJJI WEIGHT SCALE MISC: 1.0000 | 0 refills | Status: DC

## 2021-09-21 NOTE — Progress Notes (Signed)
New OB Intake  I connected with  Audrey Cantrell on 09/21/21 at  3:10 PM EDT by in person and verified that I am speaking with the correct person using two identifiers. Nurse is located at [facility] and pt is located at [place].  I discussed the limitations, risks, security and privacy concerns of performing an evaluation and management service by telephone and the availability of in person appointments. I also discussed with the patient that there may be a patient responsible charge related to this service. The patient expressed understanding and agreed to proceed.  I explained I am completing New OB Intake today. We discussed her EDD of 04/17/22 that is based on LMP of 07/11/21. Pt is G4/P2012. I reviewed her allergies, medications, Medical/Surgical/OB history, and appropriate screenings. I informed her of [facility] services. Based on history, this is a/an  pregnancy uncomplicated .   Patient Active Problem List   Diagnosis Date Noted   Neuropathic pain of both feet 02/15/2021   Acne 02/15/2021   Insomnia 01/05/2021   History of anemia 01/05/2021   Prediabetes 01/05/2021   Gestational diabetes mellitus (GDM), antepartum 08/07/2017    Concerns addressed today  Delivery Plans:  Plans to deliver at [facility].   MyChart/Babyscripts MyChart access verified. I explained pt will have some visits in office and some virtually. Babyscripts instructions given and order placed. Patient verifies receipt of registration text/e-mail. Account successfully created and app downloaded.  Blood Pressure Cuff  Blood pressure cuff ordered for patient to pick-up from [employer]. Explained after first prenatal appt pt will check weekly and document in Babyscripts.  Weight scale: Patient does not  have weight scale. Weight scale ordered for patient to pick up from [employer].   Anatomy US Explained first scheduled [place] will be around 19 weeks. Dating and viability scan performed today.  Labs Discussed  Avelina Laine genetic screening with patient. Would like both Panorama and Horizon drawn at new OB visit.Also if interested in genetic testing, tell patient she will need AFP 15-21 weeks to complete genetic testing .Routine prenatal labs needed.  Covid Vaccine Patient has covid vaccine.   Is patient a CenteringPregnancy candidate?  Declined Declined due to Group Setting Not a candidate due to  Centering Patient" indicated on sticky note   Is patient interested in [employer]?  No  "Interested in [employer] - Schedule next visit with CNM" on sticky note  Informed patient of [facility] website  and placed link in her AVS.   Social Determinants of Health Food Insecurity: Patient denies food insecurity. WIC Referral: Patient is interested in referral to [facility]  Transportation: Patient denies transportation needs. Childcare: Discussed no children allowed at ultrasound appointments. Offered childcare services; patient declines childcare services at this time.  Send link to Pregnancy Navigators   Placed OB Box on problem list and updated  First visit review I reviewed new OB appt with pt. I explained she will have a pelvic exam, ob bloodwork with genetic screening, and PAP smear. Explained pt will be seen by Sharen Counter at first visit; encounter routed to appropriate provider. Explained that patient will be seen by pregnancy navigator following visit with provider. [facility] information placed in AVS.   Hamilton Capri, RN 09/21/2021  3:27 PM

## 2021-10-04 ENCOUNTER — Ambulatory Visit (INDEPENDENT_AMBULATORY_CARE_PROVIDER_SITE_OTHER): Payer: Medicaid Other | Admitting: Advanced Practice Midwife

## 2021-10-04 VITALS — BP 108/67 | HR 83 | Wt 139.4 lb

## 2021-10-04 DIAGNOSIS — Z3481 Encounter for supervision of other normal pregnancy, first trimester: Secondary | ICD-10-CM

## 2021-10-04 DIAGNOSIS — Z3A12 12 weeks gestation of pregnancy: Secondary | ICD-10-CM

## 2021-10-04 DIAGNOSIS — O2341 Unspecified infection of urinary tract in pregnancy, first trimester: Secondary | ICD-10-CM

## 2021-10-04 DIAGNOSIS — G5793 Unspecified mononeuropathy of bilateral lower limbs: Secondary | ICD-10-CM | POA: Diagnosis not present

## 2021-10-04 DIAGNOSIS — O09521 Supervision of elderly multigravida, first trimester: Secondary | ICD-10-CM | POA: Diagnosis not present

## 2021-10-04 DIAGNOSIS — O09529 Supervision of elderly multigravida, unspecified trimester: Secondary | ICD-10-CM | POA: Diagnosis not present

## 2021-10-04 DIAGNOSIS — Z3480 Encounter for supervision of other normal pregnancy, unspecified trimester: Secondary | ICD-10-CM | POA: Diagnosis not present

## 2021-10-04 DIAGNOSIS — B951 Streptococcus, group B, as the cause of diseases classified elsewhere: Secondary | ICD-10-CM

## 2021-10-04 LAB — OB RESULTS CONSOLE GBS: GBS: POSITIVE

## 2021-10-04 MED ORDER — ASPIRIN 81 MG PO TBEC: 81.0000 mg | DELAYED_RELEASE_TABLET | Freq: Every day | ORAL | 6 refills | Status: DC

## 2021-10-04 NOTE — Progress Notes (Signed)
Subjective:   Ladaysha Soutar is a 36 y.o. G8Q7619 at 28w1dby LMP being seen today for her first obstetrical visit.  Her obstetrical history is significant for advanced maternal age and hx GDM, vaginal delivery x 2  and has History of gestational diabetes; Insomnia; History of anemia; Prediabetes; Neuropathic pain of both feet; Acne; Supervision of other normal pregnancy, antepartum; and Antepartum multigravida of advanced maternal age on their problem list.. Patient does intend to breast feed. Pregnancy history fully reviewed.  Patient reports  pain/burning of both feet at night .  HISTORY: OB History  Gravida Para Term Preterm AB Living  _0 0 1 2  SAB IAB Ectopic Multiple Live Births  0 0 0 0 2    # Outcome Date GA Lbr Len/2nd Weight Sex Delivery Anes PTL Lv  4 Current           3 AB 06/26/21 767w1d       2 Term 09/25/17 3830w2d:29 / 00:06 8 lb 3 oz (3.714 kg) M Vag-Spont None  LIV     Name: KOHTIASIA, WEBERG  Apgar1: 9  Apgar5: 9  1 Term 03/22/12     Vag-Spont   LIV   No past medical history on file. Past Surgical History:  Procedure Laterality Date   DILATION AND CURETTAGE OF UTERUS N/A 09/26/2017   Procedure: DILATATION AND CURETTAGE;  Surgeon: AndOlga MillersD;  [facility];  Laterality: N/A;   Family History  Problem Relation Age of Onset   Hyperlipidemia Mother    Depression Mother    Social History   Tobacco Use   Smoking status: Never   Smokeless tobacco: Never  Vaping Use   Vaping Use: Never used  Substance Use Topics   Alcohol use: Never   Drug use: Never   No Known Allergies Current Outpatient Medications on File Prior to Visit  Medication Sig Dispense Refill   Prenatal Vit-Fe Fumarate-FA (MULTIVITAMIN-PRENATAL) 27-0.8 MG TABS tablet Take 1 tablet by mouth daily at 12 noon.     Blood Pressure Monitoring (BLOOD PRESSURE KIT) DEVI 1 kit by Does not apply route once a week. (Patient not taking:  Reported on 10/04/2021) 1 each 0   Misc. Devices (GOJJI WEIGHT SCALE) MISC 1 Device by Does not apply route every 30 (thirty) days. (Patient not taking: Reported on 10/04/2021) 1 each 0   tretinoin (RETIN-A) 0.025 % cream Apply topically at bedtime. (Patient not taking: Reported on 01/04/2021) 45 g 3   No current facility-administered medications on file prior to visit.     Indications for ASA therapy (per uptodate) One of the following: Previous pregnancy with preeclampsia, especially early onset and with an adverse outcome No Multifetal gestation No Chronic hypertension No Type 1 or 2 diabetes mellitus No Chronic kidney disease No Autoimmune disease (antiphospholipid syndrome, systemic lupus erythematosus) No   Two or more of the following: Nulliparity No Obesity (body mass index >30 kg/m2) No Family history of preeclampsia in mother or sister No Age ?35 years Yes Sociodemographic characteristics (African American race, low socioeconomic level) No Personal risk factors (eg, previous pregnancy with low birth weight or small for gestational age infant, previous adverse pregnancy outcome [eg, stillbirth], interval >10 years between pregnancies) No   Indications for early 1 hour GTT (per uptodate)  BMI >25 (>23 in Asian women) AND one of the following  Gestational diabetes mellitus in a previous pregnancy Yes  Glycated hemoglobin ?5.7 percent (39 mmol/mol), impaired glucose tolerance, or impaired fasting glucose on previous testing No First-degree relative with diabetes No High-risk race/ethnicity (eg, African American, Latino, Native American, [place] American, Pacific Islander) Yes History of cardiovascular disease No Hypertension or on therapy for hypertension No High-density lipoprotein cholesterol level <35 mg/dL (0.90 mmol/L) and/or a triglyceride level >250 mg/dL (2.82 mmol/L) No Polycystic ovary syndrome No Physical inactivity No Other clinical condition associated with insulin  resistance (eg, severe obesity, acanthosis nigricans) No Previous birth of an infant weighing ?4000 g No Previous stillbirth of unknown cause No Exam   Vitals:   10/04/21 0919  BP: 108/67  Pulse: 83  Weight: 139 lb 6.4 oz (63.2 kg)   Fetal Heart Rate (bpm): 157  VS reviewed, nursing note reviewed,  Constitutional: well developed, well nourished, no distress HEENT: normocephalic CV: normal rate Pulm/chest wall: normal effort Abdomen: soft Neuro: alert and oriented x 3 Skin: warm, dry Psych: affect normal    Assessment:   Pregnancy: [id number] Patient Active Problem List   Diagnosis Date Noted   Antepartum multigravida of advanced maternal age 38/12/2021   Supervision of other normal pregnancy, antepartum 09/21/2021   Neuropathic pain of both feet 02/15/2021   Acne 02/15/2021   Insomnia 01/05/2021   History of anemia 01/05/2021   Prediabetes 01/05/2021   History of gestational diabetes 08/07/2017     Plan:  1. Supervision of other normal pregnancy, antepartum --Anticipatory guidance about next visits/weeks of pregnancy given.  --Pt plans international travel in 6 weeks, discussed safety of travel, recommend increased fluids and walking/stretching/movement while on plane.  Pt to get a copy of her prenatal records before leaving the country to take with her for emergencies. --Pap due, pt has 36 yo with her, so will postpone until next visit  - CBC/D/Plt+RPR+Rh+ABO+RubIgG... - Culture, OB Urine - Panorama Prenatal Test Full Panel  2. [redacted] weeks gestation of pregnancy   3. Antepartum multigravida of advanced maternal age --Healthy, Hx low/normal BPs no HTN --hx GDM in previous pregnancy --Discussed risks of PEC with AMA, BASA recommended - aspirin EC 81 MG tablet; Take 1 tablet (81 mg total) by mouth daily. Swallow whole.  Dispense: 30 tablet; Refill: 6 - [place] MFM OB DETAIL +14 WK; Future  4. Neuropathic pain of both feet --Pt has seen PCP, feels like feet are hot at  night, not painful --Try ice wraps/ankle or foot wraps at night --Consider restarted gabapentin if needed    Initial labs drawn. Continue prenatal vitamins. Discussed and offered genetic screening options, including Quad screen/AFP, NIPS testing, and option to decline testing. Benefits/risks/alternatives reviewed. Pt aware that anatomy US is form of genetic screening with lower accuracy in detecting trisomies than blood work.  Pt chooses genetic screening today. NIPS: ordered. Ultrasound discussed; fetal anatomic survey: ordered. Problem list reviewed and updated. The nature of [place] with multiple MDs and other Advanced Practice Providers was explained to patient; also emphasized that residents, students are part of our team. Routine obstetric precautions reviewed. Return in about 4 weeks (around 11/01/2021) for Any provider, pt needs to get her records at next visit for international travel.   Fatima Blank, CNM 10/04/21 1:21 PM

## 2021-10-05 LAB — HCV INTERPRETATION

## 2021-10-05 LAB — CBC/D/PLT+RPR+RH+ABO+RUBIGG...
Antibody Screen: NEGATIVE
Basophils Absolute: 0 10*3/uL (ref 0.0–0.2)
Basos: 0 %
EOS (ABSOLUTE): 0.1 10*3/uL (ref 0.0–0.4)
Eos: 1 %
HCV Ab: NONREACTIVE
HIV Screen 4th Generation wRfx: NONREACTIVE
Hematocrit: 42.1 % (ref 34.0–46.6)
Hemoglobin: 14 g/dL (ref 11.1–15.9)
Hepatitis B Surface Ag: NEGATIVE
Immature Grans (Abs): 0 10*3/uL (ref 0.0–0.1)
Immature Granulocytes: 0 %
Lymphocytes Absolute: 1.5 10*3/uL (ref 0.7–3.1)
Lymphs: 17 %
MCH: 30.4 pg (ref 26.6–33.0)
MCHC: 33.3 g/dL (ref 31.5–35.7)
MCV: 92 fL (ref 79–97)
Monocytes Absolute: 0.6 10*3/uL (ref 0.1–0.9)
Monocytes: 6 %
Neutrophils Absolute: 6.9 10*3/uL (ref 1.4–7.0)
Neutrophils: 76 %
Platelets: 200 10*3/uL (ref 150–450)
RBC: 4.6 x10E6/uL (ref 3.77–5.28)
RDW: 12.8 % (ref 11.7–15.4)
RPR Ser Ql: NONREACTIVE
Rh Factor: POSITIVE
Rubella Antibodies, IGG: 4.64 index (ref >0.99)
WBC: 9.1 10*3/uL (ref 3.4–10.8)

## 2021-10-06 ENCOUNTER — Telehealth: Payer: Self-pay

## 2021-10-06 NOTE — Telephone Encounter (Signed)
Attempted to return call, no answer, vm is not setup

## 2021-10-07 LAB — CULTURE, OB URINE

## 2021-10-07 LAB — URINE CULTURE, OB REFLEX

## 2021-10-14 DIAGNOSIS — O2341 Unspecified infection of urinary tract in pregnancy, first trimester: Secondary | ICD-10-CM | POA: Insufficient documentation

## 2021-10-14 DIAGNOSIS — B951 Streptococcus, group B, as the cause of diseases classified elsewhere: Secondary | ICD-10-CM | POA: Insufficient documentation

## 2021-10-14 MED ORDER — CEFADROXIL 500 MG PO CAPS: 500.0000 mg | ORAL_CAPSULE | Freq: Two times a day (BID) | ORAL | 0 refills | Status: AC

## 2021-10-14 NOTE — Addendum Note (Signed)
Addended by: Sharen Counter A on: 10/14/2021 06:28 AM   Modules accepted: Orders

## 2021-10-18 ENCOUNTER — Encounter: Payer: Self-pay | Admitting: Advanced Practice Midwife

## 2021-10-20 ENCOUNTER — Encounter: Payer: Self-pay | Admitting: Advanced Practice Midwife

## 2021-10-26 ENCOUNTER — Encounter: Payer: Self-pay | Admitting: Advanced Practice Midwife

## 2021-11-01 ENCOUNTER — Ambulatory Visit (INDEPENDENT_AMBULATORY_CARE_PROVIDER_SITE_OTHER): Payer: Medicaid Other | Admitting: Advanced Practice Midwife

## 2021-11-01 ENCOUNTER — Encounter: Payer: Self-pay | Admitting: Advanced Practice Midwife

## 2021-11-01 VITALS — BP 107/67 | HR 76 | Wt 141.8 lb

## 2021-11-01 DIAGNOSIS — Z3480 Encounter for supervision of other normal pregnancy, unspecified trimester: Secondary | ICD-10-CM

## 2021-11-01 DIAGNOSIS — Z3A16 16 weeks gestation of pregnancy: Secondary | ICD-10-CM

## 2021-11-01 DIAGNOSIS — Z3482 Encounter for supervision of other normal pregnancy, second trimester: Secondary | ICD-10-CM

## 2021-11-01 NOTE — Progress Notes (Signed)
   PRENATAL VISIT NOTE  Subjective:  Audrey Cantrell is a 36 y.o. Q3R0076 at [redacted]w[redacted]d being seen today for ongoing prenatal care.  She is currently monitored for the following issues for this low-risk pregnancy and has History of gestational diabetes; Insomnia; History of anemia; Prediabetes; Neuropathic pain of both feet; Acne; Supervision of other normal pregnancy, antepartum; Antepartum multigravida of advanced maternal age; and GBS (group b Streptococcus) UTI complicating pregnancy, first trimester on their problem list.  Patient reports no complaints.  Contractions: Not present. Vag. Bleeding: None.  Movement: Present. Denies leaking of fluid.   The following portions of the patient's history were reviewed and updated as appropriate: allergies, current medications, past family history, past medical history, past social history, past surgical history and problem list.   Objective:   Vitals:   11/01/21 1623  BP: 107/67  Pulse: 76  Weight: 141 lb 12.8 oz (64.3 kg)    Fetal Status: Fetal Heart Rate (bpm): 145   Movement: Present     General:  Alert, oriented and cooperative. Patient is in no acute distress.  Skin: Skin is warm and dry. No rash noted.   Cardiovascular: Normal heart rate noted  Respiratory: Normal respiratory effort, no problems with respiration noted  Abdomen: Soft, gravid, appropriate for gestational age.  Pain/Pressure: Absent     Pelvic: Cervical exam deferred        Extremities: Normal range of motion.     Mental Status: Normal mood and affect. Normal behavior. Normal judgment and thought content.   Assessment and Plan:  Pregnancy: A2Q3335 at [redacted]w[redacted]d 1. Supervision of other normal pregnancy, antepartum --Anticipatory guidance about next visits/weeks of pregnancy given.  --Pt planning international travel later this month, list of safe OTC pregnancy medications given so pt can pack first aid kit.   --Anatomy US needs to be rescheduled from 8/28 to later date as pt  will be out of town.  2. [redacted] weeks gestation of pregnancy   Preterm labor symptoms and general obstetric precautions including but not limited to vaginal bleeding, contractions, leaking of fluid and fetal movement were reviewed in detail with the patient. Please refer to After Visit Summary for other counseling recommendations.   No follow-ups on file.  Future Appointments  Date Time Provider [facility]  11/22/2021  2:45 PM [facility]  12/02/2021  4:10 PM Laury Deep, [place] None    Fatima Blank, CNM

## 2021-11-03 LAB — AFP, SERUM, OPEN SPINA BIFIDA
AFP MoM: 0.96
AFP Value: 33.9 ng/mL
Gest. Age on Collection Date: 16.1 weeks
Maternal Age At EDD: 37.9 yr
OSBR Risk 1 IN: 10000
Test Results:: NEGATIVE
Weight: 148 [lb_av]

## 2021-11-22 ENCOUNTER — Other Ambulatory Visit: Payer: Medicaid Other

## 2021-12-01 ENCOUNTER — Ambulatory Visit: Payer: Medicaid Other

## 2021-12-02 ENCOUNTER — Encounter: Payer: Self-pay | Admitting: Obstetrics and Gynecology

## 2021-12-02 ENCOUNTER — Ambulatory Visit (INDEPENDENT_AMBULATORY_CARE_PROVIDER_SITE_OTHER): Payer: Medicaid Other | Admitting: Obstetrics and Gynecology

## 2021-12-02 VITALS — BP 98/60 | HR 83 | Wt 145.0 lb

## 2021-12-02 DIAGNOSIS — Z3482 Encounter for supervision of other normal pregnancy, second trimester: Secondary | ICD-10-CM

## 2021-12-02 DIAGNOSIS — Z3480 Encounter for supervision of other normal pregnancy, unspecified trimester: Secondary | ICD-10-CM

## 2021-12-02 DIAGNOSIS — R0602 Shortness of breath: Secondary | ICD-10-CM

## 2021-12-02 DIAGNOSIS — Z3A20 20 weeks gestation of pregnancy: Secondary | ICD-10-CM

## 2021-12-02 NOTE — Progress Notes (Unsigned)
   LOW-RISK PREGNANCY OFFICE VISIT Patient name: Audrey Cantrell MRN [id number]  Date of birth: 06/21/1985 Chief Complaint:   Routine Prenatal Visit  History of Present Illness:   Audrey Cantrell is a 36 y.o. G4P2012 female at [redacted]w[redacted]d with an Estimated Date of Delivery: 04/17/22 being seen today for ongoing management of a low-risk pregnancy.  Today she reports {sx:14538}. Contractions: Not present. Vag. Bleeding: None.  Movement: Present. {Actions; denies-reports:120008} leaking of fluid. Review of Systems:   Pertinent items are noted in HPI Denies abnormal vaginal discharge w/ itching/odor/irritation, headaches, visual changes, shortness of breath, chest pain, abdominal pain, severe nausea/vomiting, or problems with urination or bowel movements unless otherwise stated above. Pertinent History Reviewed:  Reviewed past medical,surgical, social, obstetrical and family history.  Reviewed problem list, medications and allergies. Physical Assessment:   Vitals:   12/02/21 1625  BP: 98/60  Pulse: 83  Weight: 145 lb (65.8 kg)  Body mass index is 25.69 kg/m.        Physical Examination:   General appearance: Well appearing, and in no distress  Mental status: Alert, oriented to person, place, and time  Skin: Warm & dry  Cardiovascular: Normal heart rate noted  Respiratory: Normal respiratory effort, no distress  Abdomen: Soft, gravid, nontender  Pelvic: {Blank single:19197::"Cervical exam performed","Cervical exam deferred"}         Extremities: Edema: None  Fetal Status: Fetal Heart Rate (bpm): 143   Movement: Present    No results found for this or any previous visit (from the past 24 hour(s)).  Assessment & Plan:  1) Low-risk pregnancy G4P2012 at [redacted]w[redacted]d with an Estimated Date of Delivery: 04/17/22   2) ***, ***   Meds: No orders of the defined types were placed in this encounter.  Labs/procedures today: ***  Plan:  Continue routine obstetrical care ***  Reviewed: {Blank  single:19197::"Term","Preterm"} labor symptoms and general obstetric precautions including but not limited to vaginal bleeding, contractions, leaking of fluid and fetal movement were reviewed in detail with the patient.  All questions were answered. *** home bp cuff. Rx faxed to ***. Check bp weekly, let us know if >140/90.   Follow-up: Return in about 4 weeks (around 12/30/2021) for Return OB visit.  Orders Placed This Encounter  Procedures   CBC   Comp Met (CMET)   Protein / creatinine ratio, urine   AMB Referral to Cardio Obstetrics   Rolitta Dawson MSN, CNM 12/02/2021 4:40 PM 

## 2021-12-02 NOTE — Progress Notes (Unsigned)
Pt reports fetal movement, denies pain.  

## 2021-12-03 LAB — CBC
Hematocrit: 35 % (ref 34.0–46.6)
Hemoglobin: 11.6 g/dL (ref 11.1–15.9)
MCH: 30.5 pg (ref 26.6–33.0)
MCHC: 33.1 g/dL (ref 31.5–35.7)
MCV: 92 fL (ref 79–97)
Platelets: 166 10*3/uL (ref 150–450)
RBC: 3.8 x10E6/uL (ref 3.77–5.28)
RDW: 12.7 % (ref 11.7–15.4)
WBC: 9.9 10*3/uL (ref 3.4–10.8)

## 2021-12-03 LAB — COMPREHENSIVE METABOLIC PANEL
ALT: 10 IU/L (ref 0–32)
AST: 14 IU/L (ref 0–40)
Albumin/Globulin Ratio: 1.2 (ref 1.2–2.2)
Albumin: 3.6 g/dL — ABNORMAL LOW (ref 3.9–4.9)
Alkaline Phosphatase: 56 IU/L (ref 44–121)
BUN/Creatinine Ratio: 14 (ref 9–23)
BUN: 6 mg/dL (ref 6–20)
Bilirubin Total: 0.5 mg/dL (ref 0.0–1.2)
CO2: 21 mmol/L (ref 20–29)
Calcium: 8.8 mg/dL (ref 8.7–10.2)
Chloride: 101 mmol/L (ref 96–106)
Creatinine, Ser: 0.43 mg/dL — ABNORMAL LOW (ref 0.57–1.00)
Globulin, Total: 2.9 g/dL (ref 1.5–4.5)
Glucose: 76 mg/dL (ref 70–99)
Potassium: 3.8 mmol/L (ref 3.5–5.2)
Sodium: 135 mmol/L (ref 134–144)
Total Protein: 6.5 g/dL (ref 6.0–8.5)
eGFR: 128 mL/min/{1.73_m2} (ref >59)

## 2021-12-04 LAB — PROTEIN / CREATININE RATIO, URINE
Creatinine, Urine: 18.6 mg/dL
Protein, Ur: <4 mg/dL

## 2021-12-06 ENCOUNTER — Ambulatory Visit: Payer: Medicaid Other

## 2021-12-06 ENCOUNTER — Other Ambulatory Visit: Payer: Medicaid Other

## 2021-12-08 ENCOUNTER — Encounter: Payer: Self-pay | Admitting: Family Medicine

## 2021-12-10 ENCOUNTER — Ambulatory Visit: Payer: Medicaid Other | Attending: Advanced Practice Midwife

## 2021-12-10 ENCOUNTER — Other Ambulatory Visit: Payer: Self-pay | Admitting: *Deleted

## 2021-12-10 ENCOUNTER — Encounter: Payer: Self-pay | Admitting: *Deleted

## 2021-12-10 ENCOUNTER — Ambulatory Visit: Payer: Medicaid Other | Admitting: *Deleted

## 2021-12-10 VITALS — BP 104/61 | HR 82

## 2021-12-10 DIAGNOSIS — O09529 Supervision of elderly multigravida, unspecified trimester: Secondary | ICD-10-CM | POA: Insufficient documentation

## 2021-12-10 DIAGNOSIS — O09299 Supervision of pregnancy with other poor reproductive or obstetric history, unspecified trimester: Secondary | ICD-10-CM

## 2021-12-10 DIAGNOSIS — O09522 Supervision of elderly multigravida, second trimester: Secondary | ICD-10-CM | POA: Insufficient documentation

## 2021-12-10 DIAGNOSIS — Z362 Encounter for other antenatal screening follow-up: Secondary | ICD-10-CM

## 2021-12-20 NOTE — Progress Notes (Deleted)
Cardio-Obstetrics Clinic  New Evaluation  Date:  12/23/2021   ID:  Konnie Noffsinger, DOB [birth year], MRN [id number]  PCP:  Reece Leader, DO   [facility] Providers Cardiologist:  None  Electrophysiologist:  None     Referring MD: Raelyn Mora, CNM   Chief Complaint: SOB  History of Present Illness:    Suzzanne Brunkhorst is a 36 y.o. female [G4P2012] who is being seen today for the evaluation of SOB at the request of Raelyn Mora, CNM.   Patient was seen by Raelyn Mora on 12/02/21. Note reviewed. Reported that she was having significant SOB with minimal activity. Labs including CBC, CMET and protein/Cr ratio unremarkable. Given symptoms, she is now referred to Cardio OB for further evaluation.  Today, ***   Prior CV Studies Reviewed: The following studies were reviewed today: ***  No past medical history on file.  Past Surgical History:  Procedure Laterality Date   DILATION AND CURETTAGE OF UTERUS N/A 09/26/2017   Procedure: DILATATION AND CURETTAGE;  Surgeon: Levi Aland, MD;  Location: [facility] BIR[facility]ING SU[facility];  Service: Gynecology;  Laterality: N/A;   { Click here to update PMH, PSH, OB Hx then refresh note  :1}   OB History     Gravida  4   Para  2   Term  2   Preterm      AB  1   Living  2      SAB  1   IAB      Ectopic      Multiple  0   Live Births  2           { Click here to update OB Charting then refresh note  :1}    Current Medications: No outpatient medications have been marked as taking for the 12/24/21 encounter (Appointment) with Meriam Sprague, MD.     Allergies:   Patient has no known allergies.   Social History   Socioeconomic History   Marital status: Married    Spouse name: Not on file   Number of children: Not on file   Years of education: Not on file   Highest education level: Not on file  Occupational History   Not on file  Tobacco Use   Smoking status: Never   Smokeless tobacco: Never   Vaping Use   Vaping Use: Never used  Substance and Sexual Activity   Alcohol use: Never   Drug use: Never   Sexual activity: Yes    Partners: Male    Birth control/protection: None  Other Topics Concern   Not on file  Social History Narrative   Not on file   Social Determinants of Health   Financial Resource Strain: Not on file  Food Insecurity: Not on file  Transportation Needs: Not on file  Physical Activity: Not on file  Stress: Not on file  Social Connections: Not on file  { Click here to update SDOH then refresh :1}    Family History  Problem Relation Age of Onset   Hyperlipidemia Mother    Depression Mother    { Click here to update FH then refresh note    :1}   ROS:   Please see the history of present illness.    *** All other systems reviewed and are negative.   Labs/EKG Reviewed:    EKG:   EKG is *** ordered today.  The ekg ordered today demonstrates ***  Recent Labs: 02/15/2021: Magnesium 2.0; TSH 2.700 12/02/2021: ALT  10; BUN 6; Creatinine, Ser 0.43; Hemoglobin 11.6; Platelets 166; Potassium 3.8; Sodium 135   Recent Lipid Panel Lab Results  Component Value Date/Time   CHOL 121 08/11/2021 12:00 AM   TRIG 65 08/11/2021 12:00 AM   HDL 61 08/11/2021 12:00 AM   CHOLHDL 2.0 08/11/2021 12:00 AM   LDLCALC 46 08/11/2021 12:00 AM    Physical Exam:    VS:  LMP 07/11/2021     Wt Readings from Last 3 Encounters:  12/02/21 145 lb (65.8 kg)  11/01/21 141 lb 12.8 oz (64.3 kg)  10/04/21 139 lb 6.4 oz (63.2 kg)     GEN: *** Well nourished, well developed in no acute distress HEENT: Normal NECK: No JVD; No carotid bruits LYMPHATICS: No lymphadenopathy CARDIAC: ***RRR, no murmurs, rubs, gallops RESPIRATORY:  Clear to auscultation without rales, wheezing or rhonchi  ABDOMEN: Soft, non-tender, non-distended MUSCULOSKELETAL:  No edema; No deformity  SKIN: Warm and dry NEUROLOGIC:  Alert and oriented x 3 PSYCHIATRIC:  Normal affect    Risk  Assessment/Risk Calculators:   { Click to calculate CARPREG II - THEN refresh note :1}    { Click to caclulate Mod WHO Class of CV Risk - THEN refresh note :1}     { Click for [id number] Score - THEN Refresh Note    :[id number]}      ASSESSMENT & PLAN:    #SOB: -Check TTE -Check zio monitor  There are no Patient Instructions on file for this visit.   Dispo:  No follow-ups on file.   Medication Adjustments/Labs and Tests Ordered: Current medicines are reviewed at length with the patient today.  Concerns regarding medicines are outlined above.  Tests Ordered: No orders of the defined types were placed in this encounter.  Medication Changes: No orders of the defined types were placed in this encounter.

## 2021-12-24 ENCOUNTER — Encounter: Payer: Self-pay | Admitting: Cardiology

## 2021-12-24 ENCOUNTER — Ambulatory Visit (INDEPENDENT_AMBULATORY_CARE_PROVIDER_SITE_OTHER): Payer: Medicaid Other | Admitting: Cardiology

## 2021-12-24 ENCOUNTER — Ambulatory Visit: Payer: Medicaid Other | Admitting: Cardiology

## 2021-12-24 VITALS — BP 112/66 | HR 93 | Ht 63.0 in | Wt 148.0 lb

## 2021-12-24 DIAGNOSIS — R079 Chest pain, unspecified: Secondary | ICD-10-CM

## 2021-12-24 DIAGNOSIS — R0602 Shortness of breath: Secondary | ICD-10-CM

## 2021-12-24 NOTE — Patient Instructions (Addendum)
Medication Instructions:   *If you need a refill on your cardiac medications before your next appointment, please call your pharmacy*   Lab Work:  If you have labs (blood work) drawn today and your tests are completely normal, you will receive your results only by: [place] (if you have MyChart) OR A paper copy in the mail If you have any lab test that is abnormal or we need to change your treatment, we will call you to review the results.   Testing/Procedures: Cardiac-OB patient: to be performed by [facility] or [facility].   Your physician has requested that you have an echocardiogram. Echocardiography is a painless test that uses sound waves to create images of your heart. It provides your doctor with information about the size and shape of your heart and how well your heart's chambers and valves are working. This procedure takes approximately one hour. There are no restrictions for this procedure.      Follow-Up: At [facility], you and your health needs are our priority.  As part of our continuing mission to provide you with exceptional heart care, we have created designated Provider Care Teams.  These Care Teams include your primary Cardiologist (physician) and Advanced Practice Providers (APPs -  Physician Assistants and Nurse Practitioners) who all work together to provide you with the care you need, when you need it.  We recommend signing up for the patient portal called "MyChart".  Sign up information is provided on this After Visit Summary.  MyChart is used to connect with patients for Virtual Visits (Telemedicine).  Patients are able to view lab/test results, encounter notes, upcoming appointments, etc.  Non-urgent messages can be sent to your provider as well.   To learn more about what you can do with MyChart, go to [email or web address].    Your next appointment:   6 week(s)  The format for your next appointment:   In Person  Provider:   Dr Gwyndolyn Kaufman      Other Instructions   Important Information About Sugar

## 2021-12-24 NOTE — Progress Notes (Signed)
Cardio-Obstetrics Clinic  New Evaluation  Date:  12/24/2021   ID:  Audrey Cantrell, DOB 1985-06-22, MRN [id number]  PCP:  Nolene Ebbs, MD   [place] Providers Cardiologist:  None  Electrophysiologist:  None   Referring MD: Laury Deep, CNM   Chief Complaint: SOB  History of Present Illness:    Audrey Cantrell is a 36 y.o. female [[id number]] who is being seen today for the evaluation of SOB at the request of Laury Deep, CNM.   Patient seen by Laury Deep, CNM on 12/02/21. Note reviewed. She had been having episodes of SOB with minimal activity prompting referral to Cardio-OB for further evaluation.  Today, the patient is 23w5 pregnant.The patient states that prior to becoming pregnant, she suffered from some chest pain and SOB intermittently. Symptoms were not exertional or positional. She was reassured at that time. During pregnancy her symptoms persisted but now she has noticed that her shortness of breath has worsened. Symptoms worse with walking or trying to climb a flight of stairs and she feels like she has not been able to complete simple tasks without getting SOB. No exertional chest pain but continues to have occasional sharp pains in the left side of the chest. The pain in the chest lasts a few seconds before resolving. No known triggers of the chest pain and symptoms are similar to those she experienced pre-pregnancy. Otherwise, no palpitations, lightheadedness, dizziness, orthopnea or PND.   Has history of prediabetes with her second child but no issues during current pregnancy. No history of pre-eclampsia.   Prior CV Studies Reviewed: The following studies were reviewed today: No CV procedure  No past medical history on file.  Past Surgical History:  Procedure Laterality Date   DILATION AND CURETTAGE OF UTERUS N/A 09/26/2017   Procedure: DILATATION AND CURETTAGE;  Surgeon: Olga Millers, MD;  Location: [facility];  Service:  Gynecology;  Laterality: N/A;      OB History     Gravida  4   Para  2   Term  2   Preterm      AB  1   Living  2      SAB  1   IAB      Ectopic      Multiple  0   Live Births  2               Current Medications: Current Meds  Medication Sig   aspirin EC 81 MG tablet Take 1 tablet (81 mg total) by mouth daily. Swallow whole.   Blood Pressure Monitoring (BLOOD PRESSURE KIT) DEVI 1 kit by Does not apply route once a week.   Misc. Devices (GOJJI WEIGHT SCALE) MISC 1 Device by Does not apply route every 30 (thirty) days.   Prenatal Vit-Fe Fumarate-FA (MULTIVITAMIN-PRENATAL) 27-0.8 MG TABS tablet Take 1 tablet by mouth daily at 12 noon.     Allergies:   Patient has no known allergies.   Social History   Socioeconomic History   Marital status: Married    Spouse name: Not on file   Number of children: Not on file   Years of education: Not on file   Highest education level: Not on file  Occupational History   Not on file  Tobacco Use   Smoking status: Never   Smokeless tobacco: Never  Vaping Use   Vaping Use: Never used  Substance and Sexual Activity   Alcohol use: Never   Drug use: Never   Sexual  activity: Yes    Partners: Male    Birth control/protection: None  Other Topics Concern   Not on file  Social History Narrative   Not on file   Social Determinants of Health   Financial Resource Strain: Not on file  Food Insecurity: Not on file  Transportation Needs: Not on file  Physical Activity: Not on file  Stress: Not on file  Social Connections: Not on file      Family History  Problem Relation Age of Onset   Hyperlipidemia Mother    Depression Mother       ROS:   Please see the history of present illness.    All other systems reviewed and are negative.   Labs/EKG Reviewed:    EKG:   EKG is ordered today.  The ekg ordered today demonstrates NSR, HR 93  Recent Labs: 02/15/2021: Magnesium 2.0; TSH 2.700 12/02/2021: ALT 10; BUN  6; Creatinine, Ser 0.43; Hemoglobin 11.6; Platelets 166; Potassium 3.8; Sodium 135   Recent Lipid Panel Lab Results  Component Value Date/Time   CHOL 121 08/11/2021 12:00 AM   TRIG 65 08/11/2021 12:00 AM   HDL 61 08/11/2021 12:00 AM   CHOLHDL 2.0 08/11/2021 12:00 AM   LDLCALC 46 08/11/2021 12:00 AM    Physical Exam:    VS:  BP 112/66   Pulse 93   Ht '5\' 3"'  (1.6 m)   Wt 148 lb (67.1 kg)   LMP 07/11/2021   SpO2 99%   BMI 26.22 kg/m     Wt Readings from Last 3 Encounters:  12/24/21 148 lb (67.1 kg)  12/02/21 145 lb (65.8 kg)  11/01/21 141 lb 12.8 oz (64.3 kg)     GEN:  Well nourished, well developed in no acute distress HEENT: Normal NECK: No JVD; No carotid bruits CARDIAC: RRR, 1/6 systolic flow murmur RESPIRATORY:  Clear to auscultation without rales, wheezing or rhonchi  ABDOMEN: Soft, non-tender, non-distended MUSCULOSKELETAL:  No edema; No deformity  SKIN: Warm and dry NEUROLOGIC:  Alert and oriented x 3 PSYCHIATRIC:  Normal affect    Risk Assessment/Risk Calculators:     ASSESSMENT & PLAN:    #SOB: Patient reports history of progressive SOB during pregnancy. Specifically, she feels like over the past month, she has been feeling more SOB with activity. She denies exertional chest pain, orthopnea, PND, palpitations, dizziness or syncope. She is compensated and euvolemic on exam. Suspect this is related to the HD changes in pregnancy as well as the hormonal changes with gestation. Will check TTE for further management.  -Check TTE  #Chest Pain: Do not suspect cardiac etiology as episodes are not exertional with no known triggers. Symptoms last only a few seconds and then resolve. Will check TTE given SOB as above but low suspicion for underlying ischemia.   Patient Instructions  Medication Instructions:   *If you need a refill on your cardiac medications before your next appointment, please call your pharmacy*   Lab Work:  If you have labs (blood work)  drawn today and your tests are completely normal, you will receive your results only by: [place] (if you have MyChart) OR A paper copy in the mail If you have any lab test that is abnormal or we need to change your treatment, we will call you to review the results.   Testing/Procedures: Cardiac-OB patient: to be performed by [facility] or [place].   Your physician has requested that you have an echocardiogram. Echocardiography is a painless test that uses sound  waves to create images of your heart. It provides your doctor with information about the size and shape of your heart and how well your heart's chambers and valves are working. This procedure takes approximately one hour. There are no restrictions for this procedure.      Follow-Up: At [facility], you and your health needs are our priority.  As part of our continuing mission to provide you with exceptional heart care, we have created designated Provider Care Teams.  These Care Teams include your primary Cardiologist (physician) and Advanced Practice Providers (APPs -  Physician Assistants and Nurse Practitioners) who all work together to provide you with the care you need, when you need it.  We recommend signing up for the patient portal called "MyChart".  Sign up information is provided on this After Visit Summary.  MyChart is used to connect with patients for Virtual Visits (Telemedicine).  Patients are able to view lab/test results, encounter notes, upcoming appointments, etc.  Non-urgent messages can be sent to your provider as well.   To learn more about what you can do with MyChart, go to [email or web address].    Your next appointment:   6 week(s)  The format for your next appointment:   In Person  Provider:   Dr Gwyndolyn Kaufman      Other Instructions   Important Information About Sugar         Dispo:  No follow-ups on file.   Medication Adjustments/Labs and Tests Ordered: Current  medicines are reviewed at length with the patient today.  Concerns regarding medicines are outlined above.  Tests Ordered: Orders Placed This Encounter  Procedures   EKG 12-Lead   ECHOCARDIOGRAM COMPLETE   Medication Changes: No orders of the defined types were placed in this encounter.

## 2021-12-30 ENCOUNTER — Telehealth (INDEPENDENT_AMBULATORY_CARE_PROVIDER_SITE_OTHER): Payer: Medicaid Other

## 2021-12-30 VITALS — BP 118/65 | Wt 148.0 lb

## 2021-12-30 DIAGNOSIS — Z8632 Personal history of gestational diabetes: Secondary | ICD-10-CM

## 2021-12-30 DIAGNOSIS — Z3482 Encounter for supervision of other normal pregnancy, second trimester: Secondary | ICD-10-CM

## 2021-12-30 DIAGNOSIS — Z3A24 24 weeks gestation of pregnancy: Secondary | ICD-10-CM

## 2021-12-30 DIAGNOSIS — Z3480 Encounter for supervision of other normal pregnancy, unspecified trimester: Secondary | ICD-10-CM

## 2021-12-30 NOTE — Progress Notes (Signed)
I connected with Meda Dudzinski 12/30/21 at  4:10 PM EDT by: MyChart video and verified that I am speaking with the correct person using two identifiers.  Patient is located at home and provider is located at [facility].     I discussed the limitations, risks, security and privacy concerns of performing an evaluation and management service by MyChart video and the availability of in person appointments. I also discussed with the patient that there may be a patient responsible charge related to this service. By engaging in this virtual visit, you consent to the provision of healthcare.  Additionally, you authorize for your insurance to be billed for the services provided during this visit.  The patient expressed understanding and agreed to proceed.  The following staff members participated in the virtual visit:  J.Avanish Cerullo, CNM    PRENATAL VISIT NOTE  Subjective:  Audrey Cantrell is a 36 y.o. [id number] at [redacted]w[redacted]d  for virtual visit for ongoing prenatal care.  She is currently monitored for the following issues for this low-risk pregnancy and has History of gestational diabetes; Insomnia; History of anemia; Prediabetes; Neuropathic pain of both feet; Acne; Supervision of other normal pregnancy, antepartum; Antepartum multigravida of advanced maternal age; and GBS (group b Streptococcus) UTI complicating pregnancy, first trimester on their problem list.  Patient reports no complaints.  She endorses fetal movement. She denies abdominal cramping or contractions.  She goes on to report some contractions "every once and awhile." She denies vaginal concerns or issues with urination, constipation, or diarrhea.  Contractions: Irritability. Vag. Bleeding: None.  Movement: Present. Denies leaking of fluid.   The following portions of the patient's history were reviewed and updated as appropriate: allergies, current medications, past family history, past medical history, past social history, past surgical history and  problem list.   Objective:   Vitals:   12/30/21 1537  BP: 118/65  Weight: 148 lb (67.1 kg)   Self-Obtained  Fetal Status:     Movement: Present     Assessment and Plan:  Pregnancy: G2R4270 at [redacted]w[redacted]d 1. Supervision of other normal pregnancy, antepartum -Anticipatory guidance for upcoming appts. -Patient to schedule next appt in 3 weeks for an in-person visit. -Reviewed Glucola appt preparation including fasting the night before and morning of.   *Informed that it is okay to drink plain water throughout the night and prior to consumption of Glucola formula.  -Discussed anticipated office time of 2.5-3 hours.  -Reviewed blood draw procedures and labs which also include check of iron/HgB level, RPR, and HIV *Informed that repeat RPR/HIV are for pediatric records/compliance.  -Discussed how results of GTT are handled including diabetic education and BS testing for abnormal results and routine care for normal results.   2. [redacted] weeks gestation of pregnancy -Doing well overall. -Reviewed previous labs. -Reassured that PC ratio normal.   3. History of gestational diabetes -Reports glyburide usage in previous pregnancy. -Discussed obtaining GTT at next visit. -Informed that no need to come in for HgB A1c as she had one last year that was normal.    Preterm labor symptoms and general obstetric precautions including but not limited to vaginal bleeding, contractions, leaking of fluid and fetal movement were reviewed in detail with the patient.  No follow-ups on file.  Future Appointments  Date Time Provider Department Center  12/30/2021  4:10 PM Gerrit Heck, CNM [facility] None  01/07/2022  3:15 PM [facility] NURSE [facility]  01/07/2022  3:30 PM [facility] US2 [facility]  01/14/2022  3:00 PM [facility]  ECHO 4 [id number]  01/28/2022  4:20 PM Pemberton, Greer Ee, MD [facility] None     Time spent on virtual visit: 9 minutes  Maryann Conners, CNM

## 2021-12-30 NOTE — Progress Notes (Signed)
TC to patient for virtual LOB. Endorses +FM.  Reports BP 118/65, weight 145lbs.

## 2022-01-07 ENCOUNTER — Ambulatory Visit: Payer: Medicaid Other | Admitting: *Deleted

## 2022-01-07 ENCOUNTER — Ambulatory Visit: Payer: Medicaid Other | Attending: Maternal & Fetal Medicine

## 2022-01-07 VITALS — BP 105/59 | HR 86

## 2022-01-07 DIAGNOSIS — O24112 Pre-existing diabetes mellitus, type 2, in pregnancy, second trimester: Secondary | ICD-10-CM | POA: Diagnosis not present

## 2022-01-07 DIAGNOSIS — O09299 Supervision of pregnancy with other poor reproductive or obstetric history, unspecified trimester: Secondary | ICD-10-CM | POA: Diagnosis present

## 2022-01-07 DIAGNOSIS — Z8632 Personal history of gestational diabetes: Secondary | ICD-10-CM | POA: Diagnosis not present

## 2022-01-07 DIAGNOSIS — O09522 Supervision of elderly multigravida, second trimester: Secondary | ICD-10-CM | POA: Insufficient documentation

## 2022-01-07 DIAGNOSIS — Z3480 Encounter for supervision of other normal pregnancy, unspecified trimester: Secondary | ICD-10-CM

## 2022-01-07 DIAGNOSIS — Z3A25 25 weeks gestation of pregnancy: Secondary | ICD-10-CM | POA: Insufficient documentation

## 2022-01-07 DIAGNOSIS — Z363 Encounter for antenatal screening for malformations: Secondary | ICD-10-CM | POA: Diagnosis not present

## 2022-01-07 DIAGNOSIS — O09292 Supervision of pregnancy with other poor reproductive or obstetric history, second trimester: Secondary | ICD-10-CM | POA: Insufficient documentation

## 2022-01-07 DIAGNOSIS — Z362 Encounter for other antenatal screening follow-up: Secondary | ICD-10-CM | POA: Diagnosis present

## 2022-01-10 ENCOUNTER — Other Ambulatory Visit: Payer: Self-pay | Admitting: *Deleted

## 2022-01-10 DIAGNOSIS — O09523 Supervision of elderly multigravida, third trimester: Secondary | ICD-10-CM

## 2022-01-14 ENCOUNTER — Ambulatory Visit (HOSPITAL_COMMUNITY): Payer: Medicaid Other | Attending: Cardiology

## 2022-01-14 DIAGNOSIS — R0602 Shortness of breath: Secondary | ICD-10-CM | POA: Insufficient documentation

## 2022-01-14 LAB — ECHOCARDIOGRAM COMPLETE
Area-P 1/2: 3.94 cm2
S' Lateral: 2.8 cm

## 2022-01-21 ENCOUNTER — Other Ambulatory Visit: Payer: Medicaid Other

## 2022-01-21 ENCOUNTER — Encounter: Payer: Medicaid Other | Admitting: Obstetrics and Gynecology

## 2022-01-27 NOTE — Progress Notes (Signed)
Cardio-Obstetrics Clinic  Follow-up Evaluation  Date:  01/28/2022   ID:  Audrey Cantrell, DOB 1985-06-21, MRN [id number]  PCP:  Nolene Ebbs, MD   [place] Providers Cardiologist:  Freada Bergeron, MD  Electrophysiologist:  None   Referring MD: Nolene Ebbs, MD   Chief Complaint: SOB  History of Present Illness:    Audrey Cantrell is a 36 y.o. female [W4R1540] who presents for follow-up of SOB.  Patient seen by Laury Deep, CNM on 12/02/21. Note reviewed. She had been having episodes of SOB with minimal activity prompting referral to Cardio-OB for further evaluation.  Was seen in clinic on 12/24/21 with progressive SOB with minimal activity. TTE 01/14/22 showed LVEF 60-65%, GLS -27%, normal RV, normal PASP, no significant valve disease.   Today, the patient is currently [redacted]w[redacted]d She overall feels well. SOB has significantly improved. Denies chest pain, LE edema, lightheadedness, dizziness, or syncope. Blood pressure is well controlled.   Prior CV Studies Reviewed: The following studies were reviewed today: TTE 12023/11/27 IMPRESSIONS   1. Left ventricular ejection fraction, by estimation, is 60 to 65%. The  left ventricle has normal function. The left ventricle has no regional  wall motion abnormalities. Left ventricular diastolic parameters were  normal. The average left ventricular  global longitudinal strain is -27.1 %. The global longitudinal strain is  normal.   2. Right ventricular systolic function is normal. The right ventricular  size is normal. There is normal pulmonary artery systolic pressure.   3. The mitral valve is normal in structure. Trivial mitral valve  regurgitation. No evidence of mitral stenosis.   4. The aortic valve is tricuspid. Aortic valve regurgitation is not  visualized. No aortic stenosis is present.   5. The inferior vena cava is normal in size with greater than 50%  respiratory variability, suggesting right atrial  pressure of 3 mmHg.   Comparison(s): No prior Echocardiogram.   Past Medical History:  Diagnosis Date   Medical history non-contributory     Past Surgical History:  Procedure Laterality Date   DILATION AND CURETTAGE OF UTERUS N/A 09/26/2017   Procedure: DILATATION AND CURETTAGE;  Surgeon: AOlga Millers MD;  Location: [facility]  Service: Gynecology;  Laterality: N/A;      OB History     Gravida  4   Para  2   Term  2   Preterm      AB  1   Living  2      SAB  1   IAB      Ectopic      Multiple  0   Live Births  2               Current Medications: Current Meds  Medication Sig   aspirin EC 81 MG tablet Take 1 tablet (81 mg total) by mouth daily. Swallow whole.   Blood Pressure Monitoring (BLOOD PRESSURE KIT) DEVI 1 kit by Does not apply route once a week.   Misc. Devices (GOJJI WEIGHT SCALE) MISC 1 Device by Does not apply route every 30 (thirty) days.   Prenatal Vit-Fe Fumarate-FA (MULTIVITAMIN-PRENATAL) 27-0.8 MG TABS tablet Take 1 tablet by mouth daily at 12 noon.     Allergies:   Patient has no known allergies.   Social History   Socioeconomic History   Marital status: Married    Spouse name: Not on file   Number of children: Not on file   Years of education: Not on file  Highest education level: Not on file  Occupational History   Not on file  Tobacco Use   Smoking status: Never   Smokeless tobacco: Never  Vaping Use   Vaping Use: Never used  Substance and Sexual Activity   Alcohol use: Never   Drug use: Never   Sexual activity: Yes    Partners: Male    Birth control/protection: None  Other Topics Concern   Not on file  Social History Narrative   Not on file   Social Determinants of Health   Financial Resource Strain: Not on file  Food Insecurity: Not on file  Transportation Needs: Not on file  Physical Activity: Not on file  Stress: Not on file  Social Connections: Not on file      Family History  Problem  Relation Age of Onset   Hyperlipidemia Mother    Depression Mother    Asthma Neg Hx    Cancer Neg Hx    Diabetes Neg Hx    Heart disease Neg Hx    Hypertension Neg Hx    Stroke Neg Hx       ROS:   Please see the history of present illness.    All other systems reviewed and are negative.   Labs/EKG Reviewed:    EKG:   No new ECG today.  Recent Labs: 02/15/2021: Magnesium 2.0; TSH 2.700 12/02/2021: ALT 10; BUN 6; Creatinine, Ser 0.43; Hemoglobin 11.6; Platelets 166; Potassium 3.8; Sodium 135   Recent Lipid Panel Lab Results  Component Value Date/Time   CHOL 121 08/11/2021 12:00 AM   TRIG 65 08/11/2021 12:00 AM   HDL 61 08/11/2021 12:00 AM   CHOLHDL 2.0 08/11/2021 12:00 AM   LDLCALC 46 08/11/2021 12:00 AM    Physical Exam:    VS:  BP 99/60   Pulse 85   Ht 5' 3" (1.6 m)   Wt 153 lb (69.4 kg)   LMP 07/11/2021   SpO2 100%   BMI 27.10 kg/m     Wt Readings from Last 3 Encounters:  01/28/22 153 lb (69.4 kg)  12/30/21 148 lb (67.1 kg)  12/24/21 148 lb (67.1 kg)     GEN:  Well nourished, well developed in no acute distress HEENT: Normal NECK: No JVD; No carotid bruits CARDIAC: RRR, 1/6 systolic flow murmur RESPIRATORY:  Clear to auscultation without rales, wheezing or rhonchi  ABDOMEN: Soft, non-tender, non-distended MUSCULOSKELETAL:  No edema; No deformity  SKIN: Warm and dry NEUROLOGIC:  Alert and oriented x 3 PSYCHIATRIC:  Normal affect    Risk Assessment/Risk Calculators:     ASSESSMENT & PLAN:    #SOB: Patient initially presented with worsening SOB in pregnancy. TTE with normal LVEF, normal strain and no significant valve disease. Currently, feeling remarkably improved. Will continue to monitor.   #Chest Pain: Significantly improved. Do not suspect cardiac etiology as episodes are not exertional with no known triggers. Symptoms last only a few seconds and then resolve. TTE reassuring. Will continue to monitor.  Patient Instructions  Medication  Instructions:  Your physician recommends that you continue on your current medications as directed. Please refer to the Current Medication list given to you today.  *If you need a refill on your cardiac medications before your next appointment, please call your pharmacy*  Lab Work: If you have labs (blood work) drawn today and your tests are completely normal, you will receive your results only by: [place] (if you have MyChart) OR A paper copy in the mail If  you have any lab test that is abnormal or we need to change your treatment, we will call you to review the results.  Follow-Up: At [facility], you and your health needs are our priority.  As part of our continuing mission to provide you with exceptional heart care, we have created designated Provider Care Teams.  These Care Teams include your primary Cardiologist (physician) and Advanced Practice Providers (APPs -  Physician Assistants and Nurse Practitioners) who all work together to provide you with the care you need, when you need it.  We recommend signing up for the patient portal called "MyChart".  Sign up information is provided on this After Visit Summary.  MyChart is used to connect with patients for Virtual Visits (Telemedicine).  Patients are able to view lab/test results, encounter notes, upcoming appointments, etc.  Non-urgent messages can be sent to your provider as well.   To learn more about what you can do with MyChart, go to [email or web address].    Your next appointment:   As needed  The format for your next appointment:   In Person  Provider:   Freada Bergeron, MD     Important Information About Sugar         Dispo:  No follow-ups on file.   Medication Adjustments/Labs and Tests Ordered: Current medicines are reviewed at length with the patient today.  Concerns regarding medicines are outlined above.  Tests Ordered: No orders of the defined types were placed in this  encounter.  Medication Changes: No orders of the defined types were placed in this encounter.

## 2022-01-28 ENCOUNTER — Encounter: Payer: Self-pay | Admitting: Cardiology

## 2022-01-28 ENCOUNTER — Ambulatory Visit (INDEPENDENT_AMBULATORY_CARE_PROVIDER_SITE_OTHER): Payer: Medicaid Other | Admitting: Cardiology

## 2022-01-28 VITALS — BP 99/60 | HR 85 | Ht 63.0 in | Wt 153.0 lb

## 2022-01-28 DIAGNOSIS — R0602 Shortness of breath: Secondary | ICD-10-CM

## 2022-01-28 DIAGNOSIS — R079 Chest pain, unspecified: Secondary | ICD-10-CM

## 2022-01-28 NOTE — Patient Instructions (Signed)
Medication Instructions:  Your physician recommends that you continue on your current medications as directed. Please refer to the Current Medication list given to you today.  *If you need a refill on your cardiac medications before your next appointment, please call your pharmacy*  Lab Work: If you have labs (blood work) drawn today and your tests are completely normal, you will receive your results only by: [place] (if you have MyChart) OR A paper copy in the mail If you have any lab test that is abnormal or we need to change your treatment, we will call you to review the results.  Follow-Up: At [facility], you and your health needs are our priority.  As part of our continuing mission to provide you with exceptional heart care, we have created designated Provider Care Teams.  These Care Teams include your primary Cardiologist (physician) and Advanced Practice Providers (APPs -  Physician Assistants and Nurse Practitioners) who all work together to provide you with the care you need, when you need it.  We recommend signing up for the patient portal called "MyChart".  Sign up information is provided on this After Visit Summary.  MyChart is used to connect with patients for Virtual Visits (Telemedicine).  Patients are able to view lab/test results, encounter notes, upcoming appointments, etc.  Non-urgent messages can be sent to your provider as well.   To learn more about what you can do with MyChart, go to [email or web address].    Your next appointment:   As needed  The format for your next appointment:   In Person  Provider:   Freada Bergeron, MD     Important Information About Sugar

## 2022-02-03 ENCOUNTER — Other Ambulatory Visit: Payer: Medicaid Other

## 2022-02-03 ENCOUNTER — Encounter: Payer: Self-pay | Admitting: Obstetrics

## 2022-02-03 ENCOUNTER — Ambulatory Visit (INDEPENDENT_AMBULATORY_CARE_PROVIDER_SITE_OTHER): Payer: Medicaid Other | Admitting: Obstetrics and Gynecology

## 2022-02-03 VITALS — BP 99/65 | HR 89 | Wt 152.0 lb

## 2022-02-03 DIAGNOSIS — Z8632 Personal history of gestational diabetes: Secondary | ICD-10-CM

## 2022-02-03 DIAGNOSIS — Z3483 Encounter for supervision of other normal pregnancy, third trimester: Secondary | ICD-10-CM

## 2022-02-03 DIAGNOSIS — O09529 Supervision of elderly multigravida, unspecified trimester: Secondary | ICD-10-CM

## 2022-02-03 DIAGNOSIS — Z3480 Encounter for supervision of other normal pregnancy, unspecified trimester: Secondary | ICD-10-CM

## 2022-02-03 DIAGNOSIS — Z23 Encounter for immunization: Secondary | ICD-10-CM

## 2022-02-03 DIAGNOSIS — Z3A29 29 weeks gestation of pregnancy: Secondary | ICD-10-CM | POA: Diagnosis not present

## 2022-02-03 DIAGNOSIS — Z862 Personal history of diseases of the blood and blood-forming organs and certain disorders involving the immune mechanism: Secondary | ICD-10-CM

## 2022-02-03 DIAGNOSIS — O2343 Unspecified infection of urinary tract in pregnancy, third trimester: Secondary | ICD-10-CM

## 2022-02-03 DIAGNOSIS — O2341 Unspecified infection of urinary tract in pregnancy, first trimester: Secondary | ICD-10-CM

## 2022-02-03 DIAGNOSIS — O09523 Supervision of elderly multigravida, third trimester: Secondary | ICD-10-CM

## 2022-02-03 DIAGNOSIS — B951 Streptococcus, group B, as the cause of diseases classified elsewhere: Secondary | ICD-10-CM

## 2022-02-03 NOTE — Progress Notes (Signed)
ROB, she is concerned about the Aspirin so she stopped taking it.

## 2022-02-03 NOTE — Progress Notes (Signed)
   PRENATAL VISIT NOTE  Subjective:  Gwendlyn Hanback is a 36 y.o. Q6V7846 at [redacted]w[redacted]d being seen today for ongoing prenatal care.  She is currently monitored for the following issues for this low-risk pregnancy and has History of gestational diabetes; Insomnia; History of anemia; Prediabetes; Neuropathic pain of both feet; Acne; Supervision of other normal pregnancy, antepartum; Antepartum multigravida of advanced maternal age; and GBS (group b Streptococcus) UTI complicating pregnancy, first trimester on their problem list.  Patient doing well with no acute concerns today. She reports no complaints.  Contractions: Irritability.  .  Movement: Present. Denies leaking of fluid.   Pt's pharmacist had advised her not to take baby ASA due to possibility of oligohydramnios.  Discussed current studies for prevention of preeclampsia and minimal risk to fetus.  Pt will restart baby ASA.  The following portions of the patient's history were reviewed and updated as appropriate: allergies, current medications, past family history, past medical history, past social history, past surgical history and problem list. Problem list updated.  Objective:   Vitals:   02/03/22 0844  BP: 99/65  Pulse: 89  Weight: 152 lb (68.9 kg)    Fetal Status: Fetal Heart Rate (bpm): 154 Fundal Height: 29 cm Movement: Present     General:  Alert, oriented and cooperative. Patient is in no acute distress.  Skin: Skin is warm and dry. No rash noted.   Cardiovascular: Normal heart rate noted  Respiratory: Normal respiratory effort, no problems with respiration noted  Abdomen: Soft, gravid, appropriate for gestational age.  Pain/Pressure: Present     Pelvic: Cervical exam deferred        Extremities: Normal range of motion.  Edema: None  Mental Status:  Normal mood and affect. Normal behavior. Normal judgment and thought content.   Assessment and Plan:  Pregnancy: [id number] at [redacted]w[redacted]d  1. Supervision of other normal pregnancy,  antepartum Continue routine prenatal care  - Glucose Tolerance, 2 Hours w/1 Hour - RPR - CBC - HIV antibody (with reflex) - Tdap vaccine greater than or equal to 7yo IM  2. [redacted] weeks gestation of pregnancy   3. History of gestational diabetes 2 hour GTT today  4. History of anemia Check CBC today  5. Antepartum multigravida of advanced maternal age Growth scan 03/04/22  6. GBS (group b Streptococcus) UTI complicating pregnancy, first trimester Treat in labor  Preterm labor symptoms and general obstetric precautions including but not limited to vaginal bleeding, contractions, leaking of fluid and fetal movement were reviewed in detail with the patient.  Please refer to After Visit Summary for other counseling recommendations.   Return in about 2 weeks (around 02/17/2022) for ROB, in person.   Mariel Aloe, MD Faculty Attending Center for [facility]

## 2022-02-04 LAB — CBC
Hematocrit: 36.7 % (ref 34.0–46.6)
Hemoglobin: 12.2 g/dL (ref 11.1–15.9)
MCH: 30.5 pg (ref 26.6–33.0)
MCHC: 33.2 g/dL (ref 31.5–35.7)
MCV: 92 fL (ref 79–97)
Platelets: 174 10*3/uL (ref 150–450)
RBC: 4 x10E6/uL (ref 3.77–5.28)
RDW: 12.3 % (ref 11.7–15.4)
WBC: 10.4 10*3/uL (ref 3.4–10.8)

## 2022-02-04 LAB — GLUCOSE TOLERANCE, 2 HOURS W/ 1HR
Glucose, 1 hour: 208 mg/dL — ABNORMAL HIGH (ref 70–179)
Glucose, 2 hour: 175 mg/dL — ABNORMAL HIGH (ref 70–152)
Glucose, Fasting: 79 mg/dL (ref 70–91)

## 2022-02-04 LAB — HIV ANTIBODY (ROUTINE TESTING W REFLEX): HIV Screen 4th Generation wRfx: NONREACTIVE

## 2022-02-04 LAB — RPR: RPR Ser Ql: NONREACTIVE

## 2022-02-07 ENCOUNTER — Encounter: Payer: Self-pay | Admitting: Obstetrics and Gynecology

## 2022-02-07 ENCOUNTER — Telehealth: Payer: Medicaid Other | Admitting: Family Medicine

## 2022-02-07 DIAGNOSIS — R109 Unspecified abdominal pain: Secondary | ICD-10-CM

## 2022-02-07 NOTE — Patient Instructions (Signed)
Please go to the Mothers ED at [facility]

## 2022-02-07 NOTE — Progress Notes (Signed)
[place]   Sent to MAU for Urine Screening and assessment of pain in pregnancy. Pt is tearful, laying down, reports pain is strong like when she had a UTI in pregnancy.  Patient acknowledged agreement and understanding of the plan.

## 2022-02-08 ENCOUNTER — Telehealth: Payer: Self-pay | Admitting: Emergency Medicine

## 2022-02-08 ENCOUNTER — Other Ambulatory Visit: Payer: Self-pay | Admitting: Emergency Medicine

## 2022-02-08 DIAGNOSIS — O24419 Gestational diabetes mellitus in pregnancy, unspecified control: Secondary | ICD-10-CM

## 2022-02-08 MED ORDER — ACCU-CHEK GUIDE VI STRP: ORAL_STRIP | 12 refills | Status: DC

## 2022-02-08 MED ORDER — ACCU-CHEK MULTICLIX LANCETS MISC: 12 refills | Status: DC

## 2022-02-08 MED ORDER — ACCU-CHEK GUIDE ME W/DEVICE KIT: 1.0000 | PACK | Freq: Four times a day (QID) | 0 refills | Status: DC

## 2022-02-08 NOTE — Telephone Encounter (Signed)
TC to patient to discuss c/o kidney and failed Gtt results.  Pt states she is at work and requests call back after 2:30. This RN agreed to return call.

## 2022-02-08 NOTE — Progress Notes (Signed)
RC to patient to discuss UTI symptoms. Transferred to front desk to schedule apt for nurse visit.   Pt informed gestational diabetes. Supplies sent to pharmacy. Referral for nutrition.

## 2022-02-10 ENCOUNTER — Other Ambulatory Visit: Payer: Self-pay | Admitting: *Deleted

## 2022-02-10 DIAGNOSIS — O24419 Gestational diabetes mellitus in pregnancy, unspecified control: Secondary | ICD-10-CM

## 2022-02-10 MED ORDER — ACCU-CHEK SOFTCLIX LANCETS MISC: 1.0000 | Freq: Four times a day (QID) | 12 refills | Status: DC

## 2022-02-10 NOTE — Progress Notes (Signed)
Corrected order for lancets for accu check guide meter.

## 2022-02-12 ENCOUNTER — Encounter: Payer: Self-pay | Admitting: Advanced Practice Midwife

## 2022-02-14 ENCOUNTER — Telehealth: Payer: Self-pay | Admitting: Emergency Medicine

## 2022-02-14 ENCOUNTER — Ambulatory Visit (INDEPENDENT_AMBULATORY_CARE_PROVIDER_SITE_OTHER): Payer: Medicaid Other | Admitting: Advanced Practice Midwife

## 2022-02-14 ENCOUNTER — Ambulatory Visit: Payer: Medicaid Other | Admitting: Registered"

## 2022-02-14 VITALS — BP 119/74 | HR 92 | Wt 153.0 lb

## 2022-02-14 DIAGNOSIS — O26893 Other specified pregnancy related conditions, third trimester: Secondary | ICD-10-CM

## 2022-02-14 DIAGNOSIS — O26899 Other specified pregnancy related conditions, unspecified trimester: Secondary | ICD-10-CM

## 2022-02-14 DIAGNOSIS — Z3480 Encounter for supervision of other normal pregnancy, unspecified trimester: Secondary | ICD-10-CM

## 2022-02-14 DIAGNOSIS — M7918 Myalgia, other site: Secondary | ICD-10-CM

## 2022-02-14 DIAGNOSIS — Z3A31 31 weeks gestation of pregnancy: Secondary | ICD-10-CM

## 2022-02-14 DIAGNOSIS — R109 Unspecified abdominal pain: Secondary | ICD-10-CM

## 2022-02-14 LAB — POCT URINALYSIS DIPSTICK
Bilirubin, UA: NEGATIVE
Blood, UA: NEGATIVE
Glucose, UA: NEGATIVE
Ketones, UA: NEGATIVE
Leukocytes, UA: NEGATIVE
Nitrite, UA: NEGATIVE
Protein, UA: NEGATIVE
Spec Grav, UA: <=1.005 — AB (ref 1.010–1.025)
Urobilinogen, UA: 0.2 E.U./dL
pH, UA: 5 (ref 5.0–8.0)

## 2022-02-14 MED ORDER — CYCLOBENZAPRINE HCL 5 MG PO TABS: 5.0000 mg | ORAL_TABLET | Freq: Three times a day (TID) | ORAL | 0 refills | Status: AC | PRN

## 2022-02-14 NOTE — Progress Notes (Signed)
   PRENATAL VISIT NOTE  Subjective:  Audrey Cantrell is a 36 y.o. D6Q2297 at [redacted]w[redacted]d being seen today for ongoing prenatal care.  She is currently monitored for the following issues for this low-risk pregnancy and has History of gestational diabetes; Insomnia; History of anemia; Prediabetes; Neuropathic pain of both feet; Acne; Supervision of other normal pregnancy, antepartum; Antepartum multigravida of advanced maternal age; and GBS (group b Streptococcus) UTI complicating pregnancy, first trimester on their problem list.  Patient reports  pain bilaterally in low abdomen, worse with movement, episode last week that was severe but is better today and when resting .   .  .  Movement: Present. Denies leaking of fluid.   The following portions of the patient's history were reviewed and updated as appropriate: allergies, current medications, past family history, past medical history, past social history, past surgical history and problem list.   Objective:   Vitals:   02/14/22 1039  BP: 119/74  Pulse: 92  Weight: 153 lb (69.4 kg)    Fetal Status:     Movement: Present     General:  Alert, oriented and cooperative. Patient is in no acute distress.  Skin: Skin is warm and dry. No rash noted.   Cardiovascular: Normal heart rate noted  Respiratory: Normal respiratory effort, no problems with respiration noted  Abdomen: Soft, gravid, appropriate for gestational age.        Pelvic: Cervical exam deferred        Extremities: Normal range of motion.     Mental Status: Normal mood and affect. Normal behavior. Normal judgment and thought content.   Assessment and Plan:  Pregnancy: [id number] at [redacted]w[redacted]d 1. Supervision of other normal pregnancy, antepartum --Pt feeling good fetal movement --Anticipatory guidance about next visits/weeks of pregnancy given.  --Pt here for work in appt today due to abdominal pain x 1 week, see below --Keep scheduled appt next week  2. Abdominal pain affecting  pregnancy --No acute abdomen, neg CVA tenderness --Pain mostly with movement, improves with rest, was worse 1 week ago but  now a little improved but still there --Urine dip wnl today, will send for culture --Paint most c/w MSK pain --Rest/ice/heat/warm bath/increase PO fluids/Tylenol/pregnancy support belt  - cyclobenzaprine (FLEXERIL) 5 MG tablet; Take 1-2 tablets (5-10 mg total) by mouth 3 (three) times daily as needed for muscle spasms.  Dispense: 30 tablet; Refill: 0  3. Musculoskeletal pain  - cyclobenzaprine (FLEXERIL) 5 MG tablet; Take 1-2 tablets (5-10 mg total) by mouth 3 (three) times daily as needed for muscle spasms.  Dispense: 30 tablet; Refill: 0  4. [redacted] weeks gestation of pregnancy   Preterm labor symptoms and general obstetric precautions including but not limited to vaginal bleeding, contractions, leaking of fluid and fetal movement were reviewed in detail with the patient. Please refer to After Visit Summary for other counseling recommendations.   No follow-ups on file.  Future Appointments  Date Time Provider Department Center  02/14/2022  2:15 PM [facility] GDM CLASS [facility] NDM  02/22/2022  3:30 PM Gerrit Heck, CNM [facility] None  03/04/2022  3:30 PM [facility] NURSE [facility]  03/04/2022  3:45 PM [facility] US1 [facility]    Sharen Counter, CNM

## 2022-02-14 NOTE — Telephone Encounter (Signed)
TC from pt with c/o intermittent abdominal and "kidney" pain since last week. Reports pain can become severe.  Apt made here in office today for UTI evaluation.

## 2022-02-14 NOTE — Addendum Note (Signed)
Addended by: Huel Cote D on: 02/14/2022 10:56 AM   Modules accepted: Orders

## 2022-02-16 LAB — URINE CULTURE

## 2022-02-22 ENCOUNTER — Ambulatory Visit (INDEPENDENT_AMBULATORY_CARE_PROVIDER_SITE_OTHER): Payer: Medicaid Other

## 2022-02-22 VITALS — BP 105/64 | HR 85 | Wt 155.0 lb

## 2022-02-22 DIAGNOSIS — Z3A32 32 weeks gestation of pregnancy: Secondary | ICD-10-CM

## 2022-02-22 DIAGNOSIS — Z3483 Encounter for supervision of other normal pregnancy, third trimester: Secondary | ICD-10-CM

## 2022-02-22 DIAGNOSIS — Z3480 Encounter for supervision of other normal pregnancy, unspecified trimester: Secondary | ICD-10-CM

## 2022-02-22 DIAGNOSIS — O24419 Gestational diabetes mellitus in pregnancy, unspecified control: Secondary | ICD-10-CM

## 2022-02-22 NOTE — Progress Notes (Signed)
HIGH-RISK PREGNANCY OFFICE VISIT  Patient name: Audrey Cantrell MRN [id number]  Date of birth: [birth year] Chief Complaint:   Routine Prenatal Visit  Subjective:   Audrey Cantrell is a 36 y.o. C5E5277 female at [redacted]w[redacted]d with an Estimated Date of Delivery: 04/17/22 being seen today for ongoing management of a high-risk pregnancy aeb has History of gestational diabetes; Insomnia; History of anemia; Prediabetes; Neuropathic pain of both feet; Acne; Supervision of other normal pregnancy, antepartum; Antepartum multigravida of advanced maternal age; and GBS (group b Streptococcus) UTI complicating pregnancy, first trimester on their problem list.  Patient presents today, with son, with no complaints.  Patient endorses fetal movement. She reports she was concerned, last week, with increased fetal movement but it resolved.  Patient endorses occasional abdominal cramping and contractions with fetal movement.   Patient denies vaginal concerns including abnormal discharge, leaking of fluid, and bleeding.  No issues with urination, constipation, or diarrhea. Patient expresses strong desire for female only provider, at delivery. Contractions: Irritability. Vag. Bleeding: None.  Movement: Present.  Reviewed past medical,surgical, social, obstetrical and family history as well as problem list, medications and allergies.  Objective   Vitals:   02/22/22 1538  BP: 105/64  Pulse: 85  Weight: 155 lb (70.3 kg)  Body mass index is 27.46 kg/m.  Total Weight Gain:20 lb (9.072 kg)         Physical Examination:   General appearance: Well appearing, and in no distress  Mental status: Alert, oriented to person, place, and time  Skin: Warm & dry  Cardiovascular: Normal heart rate noted  Respiratory: Normal respiratory effort, no distress  Abdomen: Soft, gravid, nontender, AGA with Fundal Height: 33 cm  Pelvic: Cervical exam deferred           Extremities: Edema: None  Fetal Status: Fetal Heart Rate (bpm):  147  Movement: Present   No results found for this or any previous visit (from the past 24 hour(s)).  Assessment & Plan:  High-risk pregnancy of a 36 y.o., [id number] at [redacted]w[redacted]d with an Estimated Date of Delivery: 04/17/22   1. Supervision of other normal pregnancy, antepartum -Anticipatory guidance for upcoming appts. -Patient to schedule next appt in 2 weeks for an in-person or virtual visit per patient choice. -Discussed patient desire for [facility] provider [facility] or [facility]) in labor.  Cautioned that this may not be a possibility, but would attempt to accommodate female only preference. -Patient verbalizes understanding.  2. [redacted] weeks gestation of pregnancy -Doing well overall  3. Gestational diabetes mellitus (GDM) in third trimester, gestational diabetes method of control unspecified -Taking bASA -Glucose have been elevated: Fasting: 94, 104, 105, 102, 90 After BF (1hr): 146, 186, 161, 155 After Lunch (2hr): 143, 161, 154 After Dinner (1hr): 183, 152, 109, 115 -Reviewed diet modifications for increased control of CBGs.  Given information via AVS -Discussed that if no improvement with diet modifications will plan to start oral hypoglycemics.    -Next appt with MD for review and possible initiation of oral medications if no improvement.    Meds: No orders of the defined types were placed in this encounter.  Labs/procedures today:  Lab Orders  No laboratory test(s) ordered today     Reviewed: Preterm labor symptoms and general obstetric precautions including but not limited to vaginal bleeding, contractions, leaking of fluid and fetal movement were reviewed in detail with the patient.  All questions were answered.  Follow-up: No follow-ups on file.  No orders of the defined types were placed in  this encounter.  Audrey Conners MSN, CNM 02/22/2022

## 2022-03-04 ENCOUNTER — Ambulatory Visit: Payer: Medicaid Other | Attending: Obstetrics

## 2022-03-04 ENCOUNTER — Ambulatory Visit: Payer: Medicaid Other | Admitting: *Deleted

## 2022-03-04 ENCOUNTER — Other Ambulatory Visit: Payer: Self-pay | Admitting: *Deleted

## 2022-03-04 VITALS — BP 104/62 | HR 91

## 2022-03-04 DIAGNOSIS — Z3A33 33 weeks gestation of pregnancy: Secondary | ICD-10-CM | POA: Insufficient documentation

## 2022-03-04 DIAGNOSIS — O24410 Gestational diabetes mellitus in pregnancy, diet controlled: Secondary | ICD-10-CM | POA: Insufficient documentation

## 2022-03-04 DIAGNOSIS — Z363 Encounter for antenatal screening for malformations: Secondary | ICD-10-CM | POA: Insufficient documentation

## 2022-03-04 DIAGNOSIS — O09523 Supervision of elderly multigravida, third trimester: Secondary | ICD-10-CM | POA: Insufficient documentation

## 2022-03-04 DIAGNOSIS — O09293 Supervision of pregnancy with other poor reproductive or obstetric history, third trimester: Secondary | ICD-10-CM | POA: Insufficient documentation

## 2022-03-04 DIAGNOSIS — Z3480 Encounter for supervision of other normal pregnancy, unspecified trimester: Secondary | ICD-10-CM

## 2022-03-08 ENCOUNTER — Ambulatory Visit (INDEPENDENT_AMBULATORY_CARE_PROVIDER_SITE_OTHER): Payer: Medicaid Other | Admitting: Family Medicine

## 2022-03-08 VITALS — BP 99/66 | HR 80 | Wt 157.0 lb

## 2022-03-08 DIAGNOSIS — Z23 Encounter for immunization: Secondary | ICD-10-CM

## 2022-03-08 DIAGNOSIS — O09523 Supervision of elderly multigravida, third trimester: Secondary | ICD-10-CM

## 2022-03-08 DIAGNOSIS — O24419 Gestational diabetes mellitus in pregnancy, unspecified control: Secondary | ICD-10-CM | POA: Insufficient documentation

## 2022-03-08 DIAGNOSIS — O09529 Supervision of elderly multigravida, unspecified trimester: Secondary | ICD-10-CM

## 2022-03-08 DIAGNOSIS — Z3A34 34 weeks gestation of pregnancy: Secondary | ICD-10-CM

## 2022-03-08 DIAGNOSIS — Z3480 Encounter for supervision of other normal pregnancy, unspecified trimester: Secondary | ICD-10-CM

## 2022-03-08 DIAGNOSIS — Z8632 Personal history of gestational diabetes: Secondary | ICD-10-CM

## 2022-03-08 DIAGNOSIS — Z3483 Encounter for supervision of other normal pregnancy, third trimester: Secondary | ICD-10-CM

## 2022-03-08 MED ORDER — METFORMIN HCL 500 MG PO TABS: 500.0000 mg | ORAL_TABLET | Freq: Two times a day (BID) | ORAL | 5 refills | Status: DC

## 2022-03-08 NOTE — Progress Notes (Signed)
   PRENATAL VISIT NOTE  Subjective:  Audrey Cantrell is a 36 y.o. [id number] at [redacted]w[redacted]d being seen today for ongoing prenatal care.  She is currently monitored for the following issues for this high-risk pregnancy and has History of gestational diabetes; Insomnia; History of anemia; Prediabetes; Neuropathic pain of both feet; Acne; Supervision of other normal pregnancy, antepartum; Antepartum multigravida of advanced maternal age; GBS (group b Streptococcus) UTI complicating pregnancy, first trimester; and Gestational diabetes mellitus (GDM) affecting pregnancy, antepartum on their problem list.  Patient reports no complaints.  Contractions: Irregular.  .  Movement: Present. Denies leaking of fluid.   The following portions of the patient's history were reviewed and updated as appropriate: allergies, current medications, past family history, past medical history, past social history, past surgical history and problem list.   Objective:   Vitals:   03/08/22 1540  BP: 99/66  Pulse: 80  Weight: 157 lb (71.2 kg)    Fetal Status: Fetal Heart Rate (bpm): 150   Movement: Present     General:  Alert, oriented and cooperative. Patient is in no acute distress.  Skin: Skin is warm and dry. No rash noted.   Cardiovascular: Normal heart rate noted  Respiratory: Normal respiratory effort, no problems with respiration noted  Abdomen: Soft, gravid, appropriate for gestational age.  Pain/Pressure: Present     Pelvic: Cervical exam deferred        Extremities: Normal range of motion.     Mental Status: Normal mood and affect. Normal behavior. Normal judgment and thought content.   Assessment and Plan:  Pregnancy: N2D7824 at [redacted]w[redacted]d 1. Antepartum multigravida of advanced maternal age  36. History of gestational diabetes  3. Supervision of other normal pregnancy, antepartum Up to date FH elevated, suspect LGA on [place] Desires flu shot today  4. Gestational diabetes mellitus (GDM) affecting pregnancy,  antepartum Recommended IOL at 38 weeks-- accepts 1/7 IOL AM, discussed IOL process and patient desires female providers. Discussed the physician might be female but typically there are at least 1 female provider in the hospital.  Started metformin based on sugars-- Metformin 500mg  BID and discussed going to 1000mg  BID at next visit if needed.  Joint decision to use oral medications instead of insulin EFW 12/8 was 2727, 92nd% with AC>99th%  Has 5 of 10 fasting elevated and  PP excursions are up to 170s  Preterm labor symptoms and general obstetric precautions including but not limited to vaginal bleeding, contractions, leaking of fluid and fetal movement were reviewed in detail with the patient. Please refer to After Visit Summary for other counseling recommendations.   Return in about 1 week (around 03/15/2022) for MD only, Routine prenatal care- BG log check for possible metformin dose incease.  Future Appointments  Date Time Provider Department Center  03/16/2022  3:30 PM 03/17/2022, MD [facility] None  04/07/2022 10:15 AM [facility] NURSE [facility]  04/07/2022 10:30 AM [facility] US2 [facility]    06/06/2022, MD

## 2022-03-16 ENCOUNTER — Encounter: Payer: Medicaid Other | Admitting: Obstetrics & Gynecology

## 2022-03-18 ENCOUNTER — Ambulatory Visit (INDEPENDENT_AMBULATORY_CARE_PROVIDER_SITE_OTHER): Payer: Medicaid Other | Admitting: Obstetrics and Gynecology

## 2022-03-18 ENCOUNTER — Encounter: Payer: Self-pay | Admitting: Obstetrics and Gynecology

## 2022-03-18 ENCOUNTER — Other Ambulatory Visit (HOSPITAL_COMMUNITY)
Admission: RE | Admit: 2022-03-18 | Discharge: 2022-03-18 | Disposition: A | Discharge status: Home / Self Care | Payer: Medicaid Other | Source: Ambulatory Visit | Attending: Obstetrics & Gynecology | Admitting: Obstetrics & Gynecology

## 2022-03-18 VITALS — BP 109/71 | HR 80 | Wt 158.6 lb

## 2022-03-18 DIAGNOSIS — O3663X0 Maternal care for excessive fetal growth, third trimester, not applicable or unspecified: Secondary | ICD-10-CM

## 2022-03-18 DIAGNOSIS — Z3480 Encounter for supervision of other normal pregnancy, unspecified trimester: Secondary | ICD-10-CM | POA: Insufficient documentation

## 2022-03-18 DIAGNOSIS — Z3A35 35 weeks gestation of pregnancy: Secondary | ICD-10-CM

## 2022-03-18 DIAGNOSIS — O24415 Gestational diabetes mellitus in pregnancy, controlled by oral hypoglycemic drugs: Secondary | ICD-10-CM

## 2022-03-18 DIAGNOSIS — O3661X0 Maternal care for excessive fetal growth, first trimester, not applicable or unspecified: Secondary | ICD-10-CM

## 2022-03-18 DIAGNOSIS — O24419 Gestational diabetes mellitus in pregnancy, unspecified control: Secondary | ICD-10-CM

## 2022-03-18 NOTE — Progress Notes (Signed)
   PRENATAL VISIT NOTE  Subjective:  Audrey Cantrell is a 36 y.o. G3T5176 at [redacted]w[redacted]d being seen today for ongoing prenatal care.  She is currently monitored for the following issues for this low-risk pregnancy and has Insomnia; History of anemia; Prediabetes; Neuropathic pain of both feet; Acne; Supervision of other normal pregnancy, antepartum; Antepartum multigravida of advanced maternal age; GBS (group b Streptococcus) UTI complicating pregnancy, first trimester; and Gestational diabetes mellitus (GDM) affecting pregnancy, antepartum on their problem list.  Patient reports she is doing well. Contractions: Irritability. Vag. Bleeding: None.  Movement: Present. Denies leaking of fluid.   The following portions of the patient's history were reviewed and updated as appropriate: allergies, current medications, past family history, past medical history, past social history, past surgical history and problem list.   Objective:   Vitals:   03/18/22 1005  BP: 109/71  Pulse: 80  Weight: 158 lb 9.6 oz (71.9 kg)   Fetal Status: Fetal Heart Rate (bpm): 154 Fundal Height: 35 cm Movement: Present     General:  Alert, oriented and cooperative. Patient is in no acute distress.  Skin: Skin is warm and dry. No rash noted.   Cardiovascular: Normal heart rate noted  Respiratory: Normal respiratory effort, no problems with respiration noted  Abdomen: Soft, gravid, appropriate for gestational age.  Pain/Pressure: Present      Assessment and Plan:  Pregnancy: H6W7371 at [redacted]w[redacted]d 1. Supervision of other normal pregnancy, antepartum GBS bacteruria, GC/CT today - GC/Chlamydia probe amp ([place])not at [facility]  2. Gestational diabetes mellitus (GDM) affecting pregnancy, antepartum BG well controlled on metformin 500mg  BID (see media tab for scanned log) Given medication use, weekly BPPs ordered IOL at 37 vs 39wk discussed iso LGA fetus Reached out to MFM for coordination of care & updated recommendations,  will update note with final recs - FETAL BPP WO NON STRESS; Standing  Preterm labor symptoms and general obstetric precautions including but not limited to vaginal bleeding, contractions, leaking of fluid and fetal movement were reviewed in detail with the patient. Please refer to After Visit Summary for other counseling recommendations.   Return in about 1 week (around 03/25/2022).  Future Appointments  Date Time Provider Department Center  03/23/2022  3:30 PM 03/25/2022, MD [facility] None  04/03/2022  6:30 AM [facility] SCHED ROOM [facility] None  04/07/2022 10:15 AM [facility] NURSE [facility]  04/07/2022 10:30 AM [facility] US2 [facility]   06/06/2022, MD

## 2022-03-18 NOTE — Progress Notes (Signed)
Pt presents for ROB visit. No concern at this time.

## 2022-03-19 ENCOUNTER — Inpatient Hospital Stay (HOSPITAL_COMMUNITY)
Admission: AD | Admit: 2022-03-19 | Discharge: 2022-03-19 | Disposition: A | Discharge status: Home / Self Care | Payer: Medicaid Other | Attending: Obstetrics and Gynecology | Admitting: Obstetrics and Gynecology

## 2022-03-19 ENCOUNTER — Encounter (HOSPITAL_COMMUNITY): Payer: Self-pay | Admitting: Obstetrics and Gynecology

## 2022-03-19 DIAGNOSIS — O4703 False labor before 37 completed weeks of gestation, third trimester: Secondary | ICD-10-CM | POA: Insufficient documentation

## 2022-03-19 DIAGNOSIS — O479 False labor, unspecified: Secondary | ICD-10-CM

## 2022-03-19 DIAGNOSIS — Z3A35 35 weeks gestation of pregnancy: Secondary | ICD-10-CM | POA: Insufficient documentation

## 2022-03-19 DIAGNOSIS — O09523 Supervision of elderly multigravida, third trimester: Secondary | ICD-10-CM | POA: Insufficient documentation

## 2022-03-19 DIAGNOSIS — O26893 Other specified pregnancy related conditions, third trimester: Secondary | ICD-10-CM | POA: Insufficient documentation

## 2022-03-19 LAB — URINALYSIS, ROUTINE W REFLEX MICROSCOPIC
Bilirubin Urine: NEGATIVE
Glucose, UA: NEGATIVE mg/dL
Hgb urine dipstick: NEGATIVE
Ketones, ur: NEGATIVE mg/dL
Leukocytes,Ua: NEGATIVE
Nitrite: NEGATIVE
Protein, ur: NEGATIVE mg/dL
Specific Gravity, Urine: 1.002 — ABNORMAL LOW (ref 1.005–1.030)
pH: 7 (ref 5.0–8.0)

## 2022-03-19 NOTE — MAU Provider Note (Signed)
History     [id number]  Arrival date and time: 03/19/22 0300    Chief Complaint  Patient presents with   Contractions     HPI Audrey Cantrell is a 36 y.o. at 45w6dwho presents for contractions. Reports contractions every few minutes that started this morning. Feels pain in her lower abdomen & low back. Denies LOF or vaginal bleeding. Reports good fetal movement.     OB History     Gravida  4   Para  2   Term  2   Preterm      AB  1   Living  2      SAB  1   IAB      Ectopic      Multiple  0   Live Births  2           Past Medical History:  Diagnosis Date   Medical history non-contributory     Past Surgical History:  Procedure Laterality Date   DILATION AND CURETTAGE OF UTERUS N/A 09/26/2017   Procedure: DILATATION AND CURETTAGE;  Surgeon: AOlga Millers MD;  [place]  Service: Gynecology;  Laterality: N/A;    Family History  Problem Relation Age of Onset   Hyperlipidemia Mother    Depression Mother    Asthma Neg Hx    Cancer Neg Hx    Diabetes Neg Hx    Heart disease Neg Hx    Hypertension Neg Hx    Stroke Neg Hx     No Known Allergies  No current facility-administered medications on file prior to encounter.   Current Outpatient Medications on File Prior to Encounter  Medication Sig Dispense Refill   aspirin EC 81 MG tablet Take 1 tablet (81 mg total) by mouth daily. Swallow whole. 30 tablet 6   cyclobenzaprine (FLEXERIL) 5 MG tablet Take 1-2 tablets (5-10 mg total) by mouth 3 (three) times daily as needed for muscle spasms. 30 tablet 0   metFORMIN (GLUCOPHAGE) 500 MG tablet Take 1 tablet (500 mg total) by mouth 2 (two) times daily with a meal. 60 tablet 5   Prenatal Vit-Fe Fumarate-FA (MULTIVITAMIN-PRENATAL) 27-0.8 MG TABS tablet Take 1 tablet by mouth daily at 12 noon.     Accu-Chek Softclix Lancets lancets 1 each by Other route 4 (four) times daily. 100 each 12   Blood Glucose Monitoring Suppl (ACCU-CHEK  GUIDE ME) w/Device KIT 1 Device by Does not apply route in the morning, at noon, in the evening, and at bedtime. Check blood sugar in the morning before breakfast, after breakfast, after lunch, and after dinner. 1 kit 0   Blood Pressure Monitoring (BLOOD PRESSURE KIT) DEVI 1 kit by Does not apply route once a week. 1 each 0   glucose blood (ACCU-CHEK GUIDE) test strip Use as instructed 100 each 12   Misc. Devices (GOJJI WEIGHT SCALE) MISC 1 Device by Does not apply route every 30 (thirty) days. 1 each 0     ROS Pertinent positives and negative per HPI, all others reviewed and negative  Physical Exam   BP 111/80   Pulse 93   Ht _0  (1.6 m)   Wt 71.8 kg   LMP 07/11/2021   SpO2 98%   BMI 28.02 kg/m   Patient Vitals for the past 24 hrs:  BP Pulse SpO2 Height Weight  03/19/22 0658 111/80 93 -- -- --  03/19/22 0506 -- -- 98 % -- --  03/19/22 0501 -- -- 98 % -- --  03/19/22 0456 -- -- 98 % -- --  03/19/22 0451 -- -- 98 % -- --  03/19/22 0450 -- -- 98 % -- --  03/19/22 0446 -- -- 98 % -- --  03/19/22 0441 -- -- 98 % -- --  03/19/22 0436 -- -- 98 % -- --  03/19/22 0431 -- -- 99 % -- --  03/19/22 0426 -- -- 98 % -- --  03/19/22 0421 -- -- 98 % -- --  03/19/22 0416 -- -- 98 % -- --  03/19/22 0411 -- -- 98 % -- --  03/19/22 0406 -- -- 98 % -- --  03/19/22 0401 -- -- 98 % -- --  03/19/22 0356 -- -- 98 % -- --  03/19/22 0351 -- -- 97 % -- --  03/19/22 0346 109/76 95 97 % -- --  03/19/22 0341 -- -- 97 % -- --  03/19/22 0337 114/78 96 -- -- --  03/19/22 0330 -- -- -- _0  (1.6 m) 71.8 kg    Physical Exam Vitals and nursing note reviewed. Exam conducted with a chaperone present.  Constitutional:      General: She is not in acute distress.    Appearance: Normal appearance.  HENT:     Head: Normocephalic and atraumatic.  Eyes:     General: No scleral icterus.    Conjunctiva/sclera: Conjunctivae normal.  Pulmonary:     Effort: Pulmonary effort is normal. No respiratory  distress.  Skin:    General: Skin is warm and dry.  Neurological:     Mental Status: She is alert.  Psychiatric:        Mood and Affect: Mood normal.        Behavior: Behavior normal.      Cervical Exam Dilation: 1 Effacement (%): Thick Cervical Position: [place]: Ballotable Presentation: Vertex Exam by:: Jorje Guild NP   FHT Baseline 150, moderate variability, 15x15 accels, no decels Toco: Q 2-4 Cat: 1  Labs Results for orders placed or performed during the hospital encounter of 03/19/22 (from the past 24 hour(s))  Urinalysis, Routine w reflex microscopic Urine, Clean Catch     Status: Abnormal   Collection Time: 03/19/22  3:03 AM  Result Value Ref Range   Color, Urine STRAW (A) YELLOW   APPearance CLEAR CLEAR   Specific Gravity, Urine 1.002 (L) 1.005 - 1.030   pH 7.0 5.0 - 8.0   Glucose, UA NEGATIVE NEGATIVE mg/dL   Hgb urine dipstick NEGATIVE NEGATIVE   Bilirubin Urine NEGATIVE NEGATIVE   Ketones, ur NEGATIVE NEGATIVE mg/dL   Protein, ur NEGATIVE NEGATIVE mg/dL   Nitrite NEGATIVE NEGATIVE   Leukocytes,Ua NEGATIVE NEGATIVE    Imaging No results found.  MAU Course  Procedures Lab Orders         Urinalysis, Routine w reflex microscopic Urine, Clean Catch    No orders of the defined types were placed in this encounter.  Imaging Orders  No imaging studies ordered today    MDM Patient presents for contractions. Cervix 1/thick/ballotable.  Cervix unchanged after 3 hours of monitoring.  Assessment and Plan   1. Braxton Hick's contraction   2. [redacted] weeks gestation of pregnancy    -Reviewed labor precautions & reasons to return to [place], NP 03/19/22 6:59 AM

## 2022-03-19 NOTE — MAU Note (Signed)
..  Audrey Cantrell is a 36 y.o. at [redacted]w[redacted]d here in MAU reporting: contractions every minute. Denies vaginal bleeding or leaking of fluid. +FM.   Pain score: 5/10  PJK:DTOIZTI in room  Lab orders placed from triage:  ua

## 2022-03-22 LAB — GC/CHLAMYDIA PROBE AMP (~~LOC~~) NOT AT ARMC
Chlamydia: NEGATIVE
Comment: NEGATIVE
Comment: NORMAL
Neisseria Gonorrhea: NEGATIVE

## 2022-03-23 ENCOUNTER — Other Ambulatory Visit: Payer: Self-pay | Admitting: Obstetrics

## 2022-03-23 ENCOUNTER — Ambulatory Visit: Payer: Medicaid Other | Admitting: *Deleted

## 2022-03-23 ENCOUNTER — Ambulatory Visit (INDEPENDENT_AMBULATORY_CARE_PROVIDER_SITE_OTHER): Payer: Medicaid Other | Admitting: Obstetrics and Gynecology

## 2022-03-23 ENCOUNTER — Ambulatory Visit: Payer: Medicaid Other | Attending: Obstetrics

## 2022-03-23 VITALS — BP 105/70 | HR 84 | Wt 159.6 lb

## 2022-03-23 VITALS — BP 123/71 | HR 84

## 2022-03-23 DIAGNOSIS — O24419 Gestational diabetes mellitus in pregnancy, unspecified control: Secondary | ICD-10-CM

## 2022-03-23 DIAGNOSIS — O09523 Supervision of elderly multigravida, third trimester: Secondary | ICD-10-CM

## 2022-03-23 DIAGNOSIS — Z3A36 36 weeks gestation of pregnancy: Secondary | ICD-10-CM

## 2022-03-23 DIAGNOSIS — O24410 Gestational diabetes mellitus in pregnancy, diet controlled: Secondary | ICD-10-CM | POA: Insufficient documentation

## 2022-03-23 DIAGNOSIS — O24415 Gestational diabetes mellitus in pregnancy, controlled by oral hypoglycemic drugs: Secondary | ICD-10-CM

## 2022-03-23 DIAGNOSIS — O09293 Supervision of pregnancy with other poor reproductive or obstetric history, third trimester: Secondary | ICD-10-CM

## 2022-03-23 DIAGNOSIS — Z3480 Encounter for supervision of other normal pregnancy, unspecified trimester: Secondary | ICD-10-CM

## 2022-03-23 NOTE — Progress Notes (Signed)
Patient presents for ROB. Patient states that she has been taking robitussin and allergy relief medication for symptoms.  Fasting BS: average 90 PP average: less than 120

## 2022-03-23 NOTE — Progress Notes (Signed)
   PRENATAL VISIT NOTE  Subjective:  Audrey Cantrell is a 36 y.o. [id number] at [redacted]w[redacted]d being seen today for ongoing prenatal care.  She is currently monitored for the following issues for this low-risk pregnancy and has Insomnia; History of anemia; Prediabetes; Neuropathic pain of both feet; Acne; Supervision of other normal pregnancy, antepartum; Antepartum multigravida of advanced maternal age; GBS (group b Streptococcus) UTI complicating pregnancy, first trimester; Gestational diabetes mellitus (GDM) affecting pregnancy, antepartum; and LGA fetus on their problem list.  Patient reports no complaints.  Contractions: Irritability. Vag. Bleeding: None.  Movement: Present. Denies leaking of fluid.   The following portions of the patient's history were reviewed and updated as appropriate: allergies, current medications, past family history, past medical history, past social history, past surgical history and problem list.   Objective:   Vitals:   03/23/22 1540  BP: 105/70  Pulse: 84  Weight: 159 lb 9.6 oz (72.4 kg)    Fetal Status: Fetal Heart Rate (bpm): 130   Movement: Present     General:  Alert, oriented and cooperative. Patient is in no acute distress.  Skin: Skin is warm and dry. No rash noted.   Cardiovascular: Normal heart rate noted  Respiratory: Normal respiratory effort, no problems with respiration noted  Abdomen: Soft, gravid, appropriate for gestational age.  Pain/Pressure: Present      Assessment and Plan:  Pregnancy: G1W2993 at [redacted]w[redacted]d 1. Supervision of other normal pregnancy, antepartum Known GBS bacteruria IOL scheduled for 1/7  2. Gestational diabetes mellitus (GDM) affecting pregnancy, antepartum BG well controlled on metformin 500mg  BID Growth today with resolution of LGA - cephalic, anterior placenta, 3243g (82%), nml AFI IOL scheduled 1/7  Term labor symptoms and general obstetric precautions including but not limited to vaginal bleeding, contractions, leaking  of fluid and fetal movement were reviewed in detail with the patient.  Return in about 1 week (around 03/30/2022) for return OB.  Future Appointments  Date Time Provider Department Center  03/30/2022  4:10 PM 05/29/2022, CNM [facility] None  04/03/2022  6:30 AM [facility] SCHED ROOM [facility] None    06/02/2022, MD

## 2022-03-24 ENCOUNTER — Other Ambulatory Visit: Payer: Self-pay | Admitting: Obstetrics and Gynecology

## 2022-03-24 ENCOUNTER — Telehealth (HOSPITAL_COMMUNITY): Payer: Self-pay | Admitting: *Deleted

## 2022-03-24 ENCOUNTER — Encounter (HOSPITAL_COMMUNITY): Payer: Self-pay | Admitting: *Deleted

## 2022-03-24 DIAGNOSIS — Z3480 Encounter for supervision of other normal pregnancy, unspecified trimester: Secondary | ICD-10-CM

## 2022-03-24 NOTE — Progress Notes (Signed)
IOL orders entered 

## 2022-03-24 NOTE — Telephone Encounter (Signed)
Preadmission screen  

## 2022-03-28 ENCOUNTER — Other Ambulatory Visit: Payer: Self-pay | Admitting: Advanced Practice Midwife

## 2022-03-28 DIAGNOSIS — O24419 Gestational diabetes mellitus in pregnancy, unspecified control: Secondary | ICD-10-CM

## 2022-03-28 HISTORY — DX: Gestational diabetes mellitus in pregnancy, unspecified control: O24.419

## 2022-03-28 NOTE — L&D Delivery Note (Signed)
OB/GYN Faculty Practice Delivery Note  Audrey Cantrell is a 37 y.o. B1Y6060 s/p VD at [redacted]w[redacted]d. She was admitted for IOL.   ROM: 1h 34m with clear fluid GBS Status:  Positive/-- (07/10 0000) Maximum Maternal Temperature: 98.2  Labor Progress: Initial SVE: 3/50/-3. She then progressed to complete.   Delivery Date/Time: 04/03/2022 @1449  Delivery: Called to room and patient was complete and pushing. Head delivered LOA. Loose nuchal cord present; reduced at perineum. Shoulder and body delivered in usual fashion. Infant with spontaneous cry, placed on mother's abdomen, dried and stimulated. Cord clamped x 2 after 1-minute delay, and cut by L&D nurse per patient request. Cord blood drawn. Placenta delivered spontaneously with gentle cord traction. Fundus firm with massage and Pitocin. Labia, perineum, vagina, and cervix inspected with good hemostasis and no lacerations.  Baby Weight: pending  Placenta: 3 vessel, intact. Sent to L&D Complications: None Lacerations: None EBL: 78 mL, Hx of PPH, given TXA @ del Analgesia: Epidural   Infant:  APGAR (1 MIN): 9   APGAR (5 MINS): Morgan, DO OB Fellow, [place] for [employer] 04/03/2022, 3:03 PM

## 2022-03-30 ENCOUNTER — Encounter: Payer: Self-pay | Admitting: Obstetrics and Gynecology

## 2022-03-30 ENCOUNTER — Ambulatory Visit (INDEPENDENT_AMBULATORY_CARE_PROVIDER_SITE_OTHER): Payer: Medicaid Other | Admitting: Obstetrics and Gynecology

## 2022-03-30 VITALS — BP 112/73 | HR 78 | Wt 157.4 lb

## 2022-03-30 DIAGNOSIS — O24419 Gestational diabetes mellitus in pregnancy, unspecified control: Secondary | ICD-10-CM

## 2022-03-30 DIAGNOSIS — R12 Heartburn: Secondary | ICD-10-CM

## 2022-03-30 DIAGNOSIS — Z3483 Encounter for supervision of other normal pregnancy, third trimester: Secondary | ICD-10-CM

## 2022-03-30 DIAGNOSIS — O26893 Other specified pregnancy related conditions, third trimester: Secondary | ICD-10-CM

## 2022-03-30 DIAGNOSIS — Z3A37 37 weeks gestation of pregnancy: Secondary | ICD-10-CM

## 2022-03-30 DIAGNOSIS — Z3480 Encounter for supervision of other normal pregnancy, unspecified trimester: Secondary | ICD-10-CM

## 2022-03-30 MED ORDER — FAMOTIDINE 20 MG PO TABS: 20.0000 mg | ORAL_TABLET | Freq: Two times a day (BID) | ORAL | 0 refills | Status: DC

## 2022-03-30 NOTE — Progress Notes (Signed)
Pt presents for ROB visit. Pt requesting Rx for heartburn.

## 2022-04-03 ENCOUNTER — Inpatient Hospital Stay (HOSPITAL_COMMUNITY): Payer: Medicaid Other | Admitting: Anesthesiology

## 2022-04-03 ENCOUNTER — Other Ambulatory Visit: Payer: Self-pay

## 2022-04-03 ENCOUNTER — Encounter (HOSPITAL_COMMUNITY): Payer: Self-pay | Admitting: Obstetrics and Gynecology

## 2022-04-03 ENCOUNTER — Inpatient Hospital Stay (HOSPITAL_COMMUNITY): Payer: Medicaid Other

## 2022-04-03 ENCOUNTER — Inpatient Hospital Stay (HOSPITAL_COMMUNITY)
Admission: RE | Admit: 2022-04-03 | Discharge: 2022-04-05 | DRG: 807 | Disposition: A | Discharge status: Home / Self Care | Payer: Medicaid Other | Attending: Obstetrics and Gynecology | Admitting: Obstetrics and Gynecology

## 2022-04-03 DIAGNOSIS — B951 Streptococcus, group B, as the cause of diseases classified elsewhere: Secondary | ICD-10-CM | POA: Diagnosis present

## 2022-04-03 DIAGNOSIS — O3663X0 Maternal care for excessive fetal growth, third trimester, not applicable or unspecified: Secondary | ICD-10-CM | POA: Diagnosis present

## 2022-04-03 DIAGNOSIS — Z3A38 38 weeks gestation of pregnancy: Secondary | ICD-10-CM

## 2022-04-03 DIAGNOSIS — O24425 Gestational diabetes mellitus in childbirth, controlled by oral hypoglycemic drugs: Secondary | ICD-10-CM | POA: Diagnosis present

## 2022-04-03 DIAGNOSIS — O2341 Unspecified infection of urinary tract in pregnancy, first trimester: Secondary | ICD-10-CM | POA: Diagnosis present

## 2022-04-03 DIAGNOSIS — O99824 Streptococcus B carrier state complicating childbirth: Secondary | ICD-10-CM | POA: Diagnosis present

## 2022-04-03 DIAGNOSIS — Z3480 Encounter for supervision of other normal pregnancy, unspecified trimester: Secondary | ICD-10-CM

## 2022-04-03 DIAGNOSIS — O3661X0 Maternal care for excessive fetal growth, first trimester, not applicable or unspecified: Secondary | ICD-10-CM | POA: Diagnosis present

## 2022-04-03 DIAGNOSIS — Z3490 Encounter for supervision of normal pregnancy, unspecified, unspecified trimester: Secondary | ICD-10-CM | POA: Diagnosis present

## 2022-04-03 DIAGNOSIS — O09529 Supervision of elderly multigravida, unspecified trimester: Secondary | ICD-10-CM

## 2022-04-03 DIAGNOSIS — O24419 Gestational diabetes mellitus in pregnancy, unspecified control: Secondary | ICD-10-CM | POA: Diagnosis present

## 2022-04-03 DIAGNOSIS — O26893 Other specified pregnancy related conditions, third trimester: Secondary | ICD-10-CM | POA: Diagnosis present

## 2022-04-03 DIAGNOSIS — O3663X1 Maternal care for excessive fetal growth, third trimester, fetus 1: Secondary | ICD-10-CM

## 2022-04-03 LAB — CBC
HCT: 38.5 % (ref 36.0–46.0)
Hemoglobin: 13.3 g/dL (ref 12.0–15.0)
MCH: 30.3 pg (ref 26.0–34.0)
MCHC: 34.5 g/dL (ref 30.0–36.0)
MCV: 87.7 fL (ref 80.0–100.0)
Platelets: 209 10*3/uL (ref 150–400)
RBC: 4.39 MIL/uL (ref 3.87–5.11)
RDW: 12.4 % (ref 11.5–15.5)
WBC: 9.6 10*3/uL (ref 4.0–10.5)
nRBC: 0 % (ref 0.0–0.2)

## 2022-04-03 LAB — GLUCOSE, CAPILLARY
Glucose-Capillary: 84 mg/dL (ref 70–99)
Glucose-Capillary: 99 mg/dL (ref 70–99)

## 2022-04-03 LAB — TYPE AND SCREEN
ABO/RH(D): O POS
Antibody Screen: NEGATIVE

## 2022-04-03 LAB — RPR: RPR Ser Ql: NONREACTIVE

## 2022-04-03 MED ORDER — IBUPROFEN 600 MG PO TABS
600.0000 mg | ORAL_TABLET | Freq: Four times a day (QID) | ORAL | Status: DC
  Administered 2022-04-04 – 2022-04-05 (×5): 600 mg via ORAL
  Filled 2022-04-03 (×7): qty 1

## 2022-04-03 MED ORDER — TERBUTALINE SULFATE 1 MG/ML IJ SOLN: 0.2500 mg | Freq: Once | INTRAMUSCULAR | Status: DC | PRN

## 2022-04-03 MED ORDER — DIBUCAINE (PERIANAL) 1 % EX OINT: 1.0000 | TOPICAL_OINTMENT | CUTANEOUS | Status: DC | PRN

## 2022-04-03 MED ORDER — SODIUM CHLORIDE 0.9 % IV SOLN
5.0000 10*6.[IU] | Freq: Once | INTRAVENOUS | Status: AC
  Administered 2022-04-03: 5 10*6.[IU] via INTRAVENOUS
  Filled 2022-04-03: qty 5

## 2022-04-03 MED ORDER — LACTATED RINGERS IV SOLN: INTRAVENOUS | Status: DC

## 2022-04-03 MED ORDER — LACTATED RINGERS IV SOLN
500.0000 mL | Freq: Once | INTRAVENOUS | Status: AC
  Administered 2022-04-03: 500 mL via INTRAVENOUS

## 2022-04-03 MED ORDER — WITCH HAZEL-GLYCERIN EX PADS: 1.0000 | MEDICATED_PAD | CUTANEOUS | Status: DC | PRN

## 2022-04-03 MED ORDER — COCONUT OIL OIL: 1.0000 | TOPICAL_OIL | Status: DC | PRN

## 2022-04-03 MED ORDER — ONDANSETRON HCL 4 MG/2ML IJ SOLN: 4.0000 mg | INTRAMUSCULAR | Status: DC | PRN

## 2022-04-03 MED ORDER — ACETAMINOPHEN 325 MG PO TABS: 650.0000 mg | ORAL_TABLET | ORAL | Status: DC | PRN

## 2022-04-03 MED ORDER — ACETAMINOPHEN 325 MG PO TABS
650.0000 mg | ORAL_TABLET | ORAL | Status: DC | PRN
  Administered 2022-04-03 – 2022-04-04 (×2): 650 mg via ORAL
  Filled 2022-04-03 (×2): qty 2

## 2022-04-03 MED ORDER — PHENYLEPHRINE 80 MCG/ML (10ML) SYRINGE FOR IV PUSH (FOR BLOOD PRESSURE SUPPORT): 80.0000 ug | PREFILLED_SYRINGE | INTRAVENOUS | Status: DC | PRN

## 2022-04-03 MED ORDER — BENZOCAINE-MENTHOL 20-0.5 % EX AERO: 1.0000 | INHALATION_SPRAY | CUTANEOUS | Status: DC | PRN

## 2022-04-03 MED ORDER — OXYCODONE-ACETAMINOPHEN 5-325 MG PO TABS: 2.0000 | ORAL_TABLET | ORAL | Status: DC | PRN

## 2022-04-03 MED ORDER — EPHEDRINE 5 MG/ML INJ: 10.0000 mg | INTRAVENOUS | Status: DC | PRN

## 2022-04-03 MED ORDER — ONDANSETRON HCL 4 MG PO TABS: 4.0000 mg | ORAL_TABLET | ORAL | Status: DC | PRN

## 2022-04-03 MED ORDER — PENICILLIN G POT IN DEXTROSE 60000 UNIT/ML IV SOLN
3.0000 10*6.[IU] | INTRAVENOUS | Status: DC
  Administered 2022-04-03: 3 10*6.[IU] via INTRAVENOUS
  Filled 2022-04-03: qty 50

## 2022-04-03 MED ORDER — LACTATED RINGERS IV SOLN: 500.0000 mL | INTRAVENOUS | Status: DC | PRN

## 2022-04-03 MED ORDER — SENNOSIDES-DOCUSATE SODIUM 8.6-50 MG PO TABS
2.0000 | ORAL_TABLET | Freq: Every day | ORAL | Status: DC
  Administered 2022-04-04: 2 via ORAL
  Filled 2022-04-03: qty 2

## 2022-04-03 MED ORDER — FENTANYL-BUPIVACAINE-NACL 0.5-0.125-0.9 MG/250ML-% EP SOLN
EPIDURAL | Status: DC | PRN
  Administered 2022-04-03: 12 mL/h via EPIDURAL

## 2022-04-03 MED ORDER — TETANUS-DIPHTH-ACELL PERTUSSIS 5-2.5-18.5 LF-MCG/0.5 IM SUSY: 0.5000 mL | PREFILLED_SYRINGE | Freq: Once | INTRAMUSCULAR | Status: DC

## 2022-04-03 MED ORDER — LIDOCAINE HCL (PF) 1 % IJ SOLN
INTRAMUSCULAR | Status: DC | PRN
  Administered 2022-04-03: 10 mL via EPIDURAL
  Administered 2022-04-03: 2 mL via EPIDURAL

## 2022-04-03 MED ORDER — DIPHENHYDRAMINE HCL 25 MG PO CAPS: 25.0000 mg | ORAL_CAPSULE | Freq: Four times a day (QID) | ORAL | Status: DC | PRN

## 2022-04-03 MED ORDER — SIMETHICONE 80 MG PO CHEW: 80.0000 mg | CHEWABLE_TABLET | ORAL | Status: DC | PRN

## 2022-04-03 MED ORDER — PRENATAL MULTIVITAMIN CH
1.0000 | ORAL_TABLET | Freq: Every day | ORAL | Status: DC
  Administered 2022-04-04: 1 via ORAL
  Filled 2022-04-03: qty 1

## 2022-04-03 MED ORDER — OXYTOCIN-SODIUM CHLORIDE 30-0.9 UT/500ML-% IV SOLN
1.0000 m[IU]/min | INTRAVENOUS | Status: DC
  Administered 2022-04-03: 2 m[IU]/min via INTRAVENOUS

## 2022-04-03 MED ORDER — OXYTOCIN BOLUS FROM INFUSION
333.0000 mL | Freq: Once | INTRAVENOUS | Status: AC
  Administered 2022-04-03: 333 mL via INTRAVENOUS

## 2022-04-03 MED ORDER — OXYTOCIN-SODIUM CHLORIDE 30-0.9 UT/500ML-% IV SOLN
2.5000 [IU]/h | INTRAVENOUS | Status: DC
  Filled 2022-04-03: qty 500

## 2022-04-03 MED ORDER — FENTANYL-BUPIVACAINE-NACL 0.5-0.125-0.9 MG/250ML-% EP SOLN
12.0000 mL/h | EPIDURAL | Status: DC | PRN
  Filled 2022-04-03: qty 250

## 2022-04-03 MED ORDER — ONDANSETRON HCL 4 MG/2ML IJ SOLN: 4.0000 mg | Freq: Four times a day (QID) | INTRAMUSCULAR | Status: DC | PRN

## 2022-04-03 MED ORDER — TRANEXAMIC ACID-NACL 1000-0.7 MG/100ML-% IV SOLN
1000.0000 mg | INTRAVENOUS | Status: AC
  Administered 2022-04-03: 1000 mg via INTRAVENOUS
  Filled 2022-04-03: qty 100

## 2022-04-03 MED ORDER — SOD CITRATE-CITRIC ACID 500-334 MG/5ML PO SOLN: 30.0000 mL | ORAL | Status: DC | PRN

## 2022-04-03 MED ORDER — DIPHENHYDRAMINE HCL 50 MG/ML IJ SOLN: 12.5000 mg | INTRAMUSCULAR | Status: DC | PRN

## 2022-04-03 MED ORDER — ZOLPIDEM TARTRATE 5 MG PO TABS: 5.0000 mg | ORAL_TABLET | Freq: Every evening | ORAL | Status: DC | PRN

## 2022-04-03 MED ORDER — OXYCODONE-ACETAMINOPHEN 5-325 MG PO TABS: 1.0000 | ORAL_TABLET | ORAL | Status: DC | PRN

## 2022-04-03 MED ORDER — LIDOCAINE HCL (PF) 1 % IJ SOLN: 30.0000 mL | INTRAMUSCULAR | Status: DC | PRN

## 2022-04-03 NOTE — Progress Notes (Signed)
Labor Progress Note Karna Abed is a 37 y.o. [id number] at [redacted]w[redacted]d presented for IOL.   S: No acute concerns  O:  BP 115/76   Pulse 81   Temp 98.2 F (36.8 C) (Oral)   Resp 18   Ht 5\' 3"  (1.6 m)   Wt 71.1 kg   LMP 07/11/2021   BMI 27.78 kg/m  EFM: 120bpm/moderate/+accels, no decels  CVE: Dilation: 6 Effacement (%): 80 Station: -1 Presentation: Vertex Exam by:: Dr. Janus Molder   A&P: 37 y.o. K4Y1856 [redacted]w[redacted]d here for IOL 2/2 GDM, LGA.  #Labor: ON 4 of pit. Progressing well. S/p AROM clear fluid.  #Pain: Epidural #FWB: Cat I  #GBS positive  A2GDM CBG 99. Goal 90-120 -Monitoring q2h   Chealsea Paske Autry-Lott, DO 1:53 PM

## 2022-04-03 NOTE — Anesthesia Preprocedure Evaluation (Signed)
Anesthesia Evaluation  Patient identified by MRN, date of birth, ID band Patient awake    Reviewed: Allergy & Precautions, Patient's Chart, lab work & pertinent test results  Airway Mallampati: II  TM Distance: >3 FB Neck ROM: Full    Dental no notable dental hx.    Pulmonary neg pulmonary ROS   Pulmonary exam normal breath sounds clear to auscultation       Cardiovascular negative cardio ROS Normal cardiovascular exam Rhythm:Regular Rate:Normal     Neuro/Psych negative neurological ROS  negative psych ROS   GI/Hepatic negative GI ROS, Neg liver ROS,,,  Endo/Other  diabetes, Gestational, Oral Hypoglycemic Agents    Renal/GU negative Renal ROS  negative genitourinary   Musculoskeletal negative musculoskeletal ROS (+)    Abdominal   Peds negative pediatric ROS (+)  Hematology negative hematology ROS (+) Hb 13.3, plt 209   Anesthesia Other Findings   Reproductive/Obstetrics (+) Pregnancy                             Anesthesia Physical Anesthesia Plan  ASA: 2  Anesthesia Plan: Epidural   Post-op Pain Management:    Induction:   PONV Risk Score and Plan: 2  Airway Management Planned: Natural Airway  Additional Equipment: None  Intra-op Plan:   Post-operative Plan:   Informed Consent: I have reviewed the patients History and Physical, chart, labs and discussed the procedure including the risks, benefits and alternatives for the proposed anesthesia with the patient or authorized representative who has indicated his/her understanding and acceptance.       Plan Discussed with:   Anesthesia Plan Comments:        Anesthesia Quick Evaluation

## 2022-04-03 NOTE — H&P (Addendum)
OBSTETRIC ADMISSION HISTORY AND PHYSICAL  Lisaanne Lawrie is a 37 y.o. female [phone] with IUP at [redacted]w[redacted]d by LMP presenting for IOL. She reports +FMs, No LOF, no VB, no blurry vision, headaches or peripheral edema, and RUQ pain.  She plans on breast feeding. She request depo for birth control. She received her prenatal care at  [facility]    Dating: By LMP --->  Estimated Date of Delivery: 04/17/22  Sono:    @[redacted]w[redacted]d , CWD, normal anatomy, cephalic presentation, anterior placental lie, 3243g, 82% EFW  Prenatal History/Complications:  - A2GDM - LGA fetus - AMA  Past Medical History: Past Medical History:  Diagnosis Date   Medical history non-contributory     Past Surgical History: Past Surgical History:  Procedure Laterality Date   DILATION AND CURETTAGE OF UTERUS N/A 09/26/2017   Procedure: DILATATION AND CURETTAGE;  Surgeon: 11/27/2017, MD;  Location: [facility] BIRTHING SUITES;  Service: Gynecology;  Laterality: N/A;    Obstetrical History: OB History     Gravida  4   Para  2   Term  2   Preterm      AB  1   Living  2      SAB  1   IAB      Ectopic      Multiple  0   Live Births  2           Social History Social History   Socioeconomic History   Marital status: Married    Spouse name: Not on file   Number of children: Not on file   Years of education: Not on file   Highest education level: Not on file  Occupational History   Not on file  Tobacco Use   Smoking status: Never   Smokeless tobacco: Never  Vaping Use   Vaping Use: Never used  Substance and Sexual Activity   Alcohol use: Never   Drug use: Never   Sexual activity: Yes    Partners: Male    Birth control/protection: None  Other Topics Concern   Not on file  Social History Narrative   Not on file   Social Determinants of Health   Financial Resource Strain: Not on file  Food Insecurity: No Food Insecurity (04/03/2022)   Hunger Vital Sign    Worried About Running Out of Food in the  Last Year: Never true    Ran Out of Food in the Last Year: Never true  Transportation Needs: No Transportation Needs (04/03/2022)   PRAPARE - 06/02/2022 (Medical): No    Lack of Transportation (Non-Medical): No  Physical Activity: Not on file  Stress: Not on file  Social Connections: Not on file    Family History: Family History  Problem Relation Age of Onset   Hyperlipidemia Mother    Depression Mother    Asthma Neg Hx    Cancer Neg Hx    Diabetes Neg Hx    Heart disease Neg Hx    Hypertension Neg Hx    Stroke Neg Hx     Allergies: No Known Allergies  Medications Prior to Admission  Medication Sig Dispense Refill Last Dose   Accu-Chek Softclix Lancets lancets 1 each by Other route 4 (four) times daily. 100 each 12    aspirin EC 81 MG tablet Take 1 tablet (81 mg total) by mouth daily. Swallow whole. (Patient not taking: Reported on 03/23/2022) 30 tablet 6    Blood Glucose Monitoring Suppl (ACCU-CHEK  GUIDE ME) w/Device KIT 1 Device by Does not apply route in the morning, at noon, in the evening, and at bedtime. Check blood sugar in the morning before breakfast, after breakfast, after lunch, and after dinner. 1 kit 0    Blood Pressure Monitoring (BLOOD PRESSURE KIT) DEVI 1 kit by Does not apply route once a week. 1 each 0    cyclobenzaprine (FLEXERIL) 5 MG tablet Take 1-2 tablets (5-10 mg total) by mouth 3 (three) times daily as needed for muscle spasms. 30 tablet 0    famotidine (PEPCID) 20 MG tablet Take 1 tablet (20 mg total) by mouth 2 (two) times daily. 60 tablet 0    glucose blood (ACCU-CHEK GUIDE) test strip Use as instructed 100 each 12    metFORMIN (GLUCOPHAGE) 500 MG tablet Take 1 tablet (500 mg total) by mouth 2 (two) times daily with a meal. 60 tablet 5    Misc. Devices (GOJJI WEIGHT SCALE) MISC 1 Device by Does not apply route every 30 (thirty) days. 1 each 0    Prenatal Vit-Fe Fumarate-FA (MULTIVITAMIN-PRENATAL) 27-0.8 MG TABS tablet Take  1 tablet by mouth daily at 12 noon.        Review of Systems   All systems reviewed and negative except as stated in HPI  Blood pressure 123/73, pulse 80, temperature 98.1 F (36.7 C), temperature source Oral, resp. rate 16, height 5\' 3"  (1.6 m), weight 71.1 kg, last menstrual period 07/11/2021, unknown if currently breastfeeding. General appearance: alert and no distress Lungs: normal effort Heart: regular rate and rhythm Abdomen: gravid Presentation:  vertex per RN exam and recent 07/13/2021 Fetal monitoringBaseline: 130 bpm, Variability: Good {> 6 bpm), Accelerations: Reactive, and Decelerations: Absent Uterine activity every 2 mins Dilation: 3 Effacement (%): 50 Station: -3 Exam by:: K.Haynes, RN  Prenatal labs: ABO, Rh: --/--/O POS (01/07 [birth year]-1972) Antibody: NEG (01/07 03-15-1972) Rubella: 4.64 (07/10 1044) RPR: Non Reactive (11/09 0926)  HBsAg: Negative (07/10 1044)  HIV: Non Reactive (11/09 0926)  GBS:   Bacteriuria  1 hr Glucola 208 Genetic screening  LR, female Anatomy [birth year]1973 wnl, LGA  Prenatal Transfer Tool  Maternal Diabetes: Yes:  Diabetes Type:  Insulin/Medication controlled Genetic Screening: Normal Maternal Ultrasounds/Referrals: Normal Fetal Ultrasounds or other Referrals:  Referred to Materal Fetal Medicine , Other:  LGA  Maternal Substance Abuse:  No Significant Maternal Medications:  None Significant Maternal Lab Results:  Group B Strep positive Number of Prenatal Visits:greater than 3 verified prenatal visits Other Comments:  None  Results for orders placed or performed during the hospital encounter of 04/03/22 (from the past 24 hour(s))  Type and screen   Collection Time: 04/03/22  8:08 AM  Result Value Ref Range   ABO/RH(D) O POS    Antibody Screen NEG    Sample Expiration      04/06/2022,2359 Performed at [facility], [address] [place], [place]   CBC   Collection Time: 04/03/22  8:13 AM  Result Value Ref Range   WBC 9.6 4.0 - 10.5 K/uL    RBC 4.39 3.87 - 5.11 MIL/uL   Hemoglobin 13.3 12.0 - 15.0 g/dL   HCT 06/02/22 78.4 - 69.6 %   MCV 87.7 80.0 - 100.0 fL   MCH 30.3 26.0 - 34.0 pg   MCHC 34.5 30.0 - 36.0 g/dL   RDW 29.5 28.4 - 13.2 %   Platelets 209 150 - 400 K/uL   nRBC 0.0 0.0 - 0.2 %  Glucose, capillary  Collection Time: 04/03/22  8:39 AM  Result Value Ref Range   Glucose-Capillary 84 70 - 99 mg/dL   Comment 1 Document in Chart     Patient Active Problem List   Diagnosis Date Noted   Encounter for induction of labor 04/03/2022   LGA fetus 03/18/2022   Gestational diabetes mellitus (GDM) affecting pregnancy, antepartum 03/08/2022   GBS (group b Streptococcus) UTI complicating pregnancy, first trimester 10/14/2021   Antepartum multigravida of advanced maternal age 48/12/2021   Supervision of other normal pregnancy, antepartum 09/21/2021   Neuropathic pain of both feet 02/15/2021   Acne 02/15/2021   Insomnia 01/05/2021   History of anemia 01/05/2021   Prediabetes 01/05/2021    Assessment/Plan:  Mackenzey Crownover is a 37 y.o. K9T2671 at [redacted]w[redacted]d here for IOL 2/2 A2GDM, well controlled w/ LGA fetus.  #Labor:Start pit, AROM when appropriate #Pain: Planning for epidural #FWB: Cat I  #ID: GBS bacteruria, ppx #MOF: Breast #MOC: Depo #Circ:  N/a, girl  A2GDM CBG 84. Goal 90-120 -Monitoring q4h latent labor, q2h active labor  Tamlyn Sides Autry-Lott, DO  04/03/2022, 10:13 AM

## 2022-04-03 NOTE — Anesthesia Procedure Notes (Signed)
Epidural Patient location during procedure: OB Start time: 04/03/2022 9:58 AM End time: 04/03/2022 10:09 AM  Staffing Anesthesiologist: Pervis Hocking, DO Performed: anesthesiologist   Preanesthetic Checklist Completed: patient identified, IV checked, risks and benefits discussed, monitors and equipment checked, pre-op evaluation and timeout performed  Epidural Patient position: sitting Prep: DuraPrep and site prepped and draped Patient monitoring: continuous pulse ox, blood pressure, heart rate and cardiac monitor Approach: midline Location: L3-L4 Injection technique: LOR air  Needle:  Needle type: Tuohy  Needle gauge: 17 G Needle length: 9 cm Needle insertion depth: 4 cm Catheter type: closed end flexible Catheter size: 19 Gauge Catheter at skin depth: 9 cm Test dose: negative  Assessment Sensory level: T8 Events: blood not aspirated, no cerebrospinal fluid, injection not painful, no injection resistance, no paresthesia and negative IV test  Additional Notes Patient identified. Risks/Benefits/Options discussed with patient including but not limited to bleeding, infection, nerve damage, paralysis, failed block, incomplete pain control, headache, blood pressure changes, nausea, vomiting, reactions to medication both or allergic, itching and postpartum back pain. Confirmed with bedside nurse the patient's most recent platelet count. Confirmed with patient that they are not currently taking any anticoagulation, have any bleeding history or any family history of bleeding disorders. Patient expressed understanding and wished to proceed. All questions were answered. Sterile technique was used throughout the entire procedure. Please see nursing notes for vital signs. Test dose was given through epidural catheter and negative prior to continuing to dose epidural or start infusion. Warning signs of high block given to the patient including shortness of breath, tingling/numbness in  hands, complete motor block, or any concerning symptoms with instructions to call for help. Patient was given instructions on fall risk and not to get out of bed. All questions and concerns addressed with instructions to call with any issues or inadequate analgesia.  Reason for block:procedure for pain

## 2022-04-03 NOTE — Discharge Summary (Signed)
Postpartum Discharge Summary  Date of Service updated***     Patient Name: Audrey Cantrell DOB: Apr 15, 1985 MRN: [id number]  Date of admission: 04/03/2022 Delivery date:04/03/2022  Delivering provider:   Date of discharge: 04/03/2022  Admitting diagnosis: Encounter for induction of labor [Z34.90] Intrauterine pregnancy: [redacted]w[redacted]d     Secondary diagnosis:  Principal Problem:   Encounter for induction of labor Active Problems:   Antepartum multigravida of advanced maternal age   GBS (group b Streptococcus) UTI complicating pregnancy, first trimester   Gestational diabetes mellitus (GDM) affecting pregnancy, antepartum   LGA fetus   Vaginal delivery  Additional problems: ***    Discharge diagnosis: {DX.:23714}                                              Post partum procedures:{Postpartum procedures:23558} Augmentation: AROM and Pitocin Complications: None  Hospital course: Induction of Labor With Vaginal Delivery   37 y.o. yo I7P8242 at [redacted]w[redacted]d was admitted to the hospital 04/03/2022 for induction of labor.  Indication for induction: A2 DM, AMA, and LGA .  Patient had an labor course complicated by precipitous delivery.  Membrane Rupture Time/Date: 1:42 PM ,04/03/2022   Delivery Method:Vaginal, Spontaneous  Episiotomy: None  Lacerations:  None  Details of delivery can be found in separate delivery note.  Patient had a postpartum course complicated by***. Patient is discharged home 04/03/22.  Newborn Data: Birth date:04/03/2022  Birth time:2:49 PM  Gender:Female  Living status:Living  Apgars:9 ,9  Weight:   Magnesium Sulfate received: No BMZ received: No Rhophylac:No MMR:{MMR:30440033} T-DaP:{Tdap:23962} Flu: {PNT:61443} Transfusion:{Transfusion received:30440034}  Physical exam  Vitals:   04/03/22 1231 04/03/22 1301 04/03/22 1331 04/03/22 1401  BP: 113/76 108/79 115/76 112/85  Pulse: 67 75 81 78  Resp: 16 18 18 16   Temp:      TempSrc:      Weight:      Height:        General: {Exam; general:21111117} Lochia: {Desc; appropriate/inappropriate:30686::"appropriate"} Uterine Fundus: {Desc; firm/soft:30687} Incision: {Exam; incision:21111123} DVT Evaluation: {Exam; dvt:2111122} Labs: Lab Results  Component Value Date   WBC 9.6 04/03/2022   HGB 13.3 04/03/2022   HCT 38.5 04/03/2022   MCV 87.7 04/03/2022   PLT 209 04/03/2022      Latest Ref Rng & Units 12/02/2021    4:45 PM  CMP  Glucose 70 - 99 mg/dL 76   BUN 6 - 20 mg/dL 6   Creatinine 0.57 - 1.00 mg/dL 0.43   Sodium 134 - 144 mmol/L 135   Potassium 3.5 - 5.2 mmol/L 3.8   Chloride 96 - 106 mmol/L 101   CO2 20 - 29 mmol/L 21   Calcium 8.7 - 10.2 mg/dL 8.8   Total Protein 6.0 - 8.5 g/dL 6.5   Total Bilirubin 0.0 - 1.2 mg/dL 0.5   Alkaline Phos 44 - 121 IU/L 56   AST 0 - 40 IU/L 14   ALT 0 - 32 IU/L 10    Edinburgh Score:     No data to display           After visit meds:  Allergies as of 04/03/2022   No Known Allergies   Med Rec must be completed prior to using this [facility]***        Discharge home in stable condition Infant Feeding: {Baby feeding:23562} Infant Disposition:{CHL IP OB HOME WITH XVQMGQ:67619} Discharge  instruction: per After Visit Summary and Postpartum booklet. Activity: Advance as tolerated. Pelvic rest for 6 weeks.  Diet: {OB CJAR:01100349} Future Appointments:No future appointments. Follow up Visit:  Message sent to [facility] by Autry-Lott on 04/03/2022  Please schedule this patient for a In person postpartum visit in 6 weeks with the following provider: Any provider. Additional Postpartum F/U:2 hour GTT  High risk pregnancy complicated by: GDM and LGA, AMA Delivery mode:  Vaginal, Spontaneous  Anticipated Birth Control:  PP Depo given***   04/03/2022 Simone Autry-Lott, DO

## 2022-04-04 ENCOUNTER — Encounter (HOSPITAL_COMMUNITY): Payer: Self-pay | Admitting: Obstetrics and Gynecology

## 2022-04-04 MED ORDER — MEDROXYPROGESTERONE ACETATE 150 MG/ML IM SUSP
150.0000 mg | Freq: Once | INTRAMUSCULAR | Status: AC
  Administered 2022-04-04: 150 mg via INTRAMUSCULAR
  Filled 2022-04-04: qty 1

## 2022-04-04 NOTE — Lactation Note (Signed)
This note was copied from a baby's chart. Lactation Consultation Note  Patient Name: Audrey Cantrell [id number] Date: 04/04/2022   Age:38 hours Experienced BF mom declines the need for Lactation services.  Maternal Data    Feeding    LATCH Score                    Lactation Tools Discussed/Used    Interventions    Discharge    Consult Status Consult Status: Complete    [name] G 04/04/2022, 2:21 AM

## 2022-04-04 NOTE — Anesthesia Postprocedure Evaluation (Signed)
Anesthesia Post Note  Patient: Audrey Cantrell  Procedure(s) Performed: AN AD HOC LABOR EPIDURAL     Patient location during evaluation: Mother Baby Anesthesia Type: Epidural Level of consciousness: awake and alert Pain management: pain level controlled Vital Signs Assessment: post-procedure vital signs reviewed and stable Respiratory status: spontaneous breathing, nonlabored ventilation and respiratory function stable Cardiovascular status: stable Postop Assessment: no headache, no backache and epidural receding Anesthetic complications: no   No notable events documented.  Last Vitals:  Vitals:   04/04/22 0037 04/04/22 0417  BP: 108/72 97/67  Pulse: 85 85  Resp: 16 17  Temp: 36.8 C 36.9 C  SpO2: 98% 98%    Last Pain:  Vitals:   04/04/22 0542  TempSrc:   PainSc: 0-No pain   Pain Goal: Patients Stated Pain Goal: 2 (04/04/22 0404)                 Gilmer Mor

## 2022-04-04 NOTE — Progress Notes (Signed)
Infant is needing to stay another night per pediatrician. Discussed rooming in with infant; however, patient would like to stay a patient since today would be an early discharge and she is still needing pain control medications. Called Dr. Jerilee Hoh who ordered that discharge order may be canceled. Maxwell Caul, Leretha Dykes [place]

## 2022-04-04 NOTE — Discharge Instructions (Signed)
-   Continue your prenatal vitamins especially if breastfeeding - Try to eat iron rich food. - Take over the counter tylenol (500mg ) or ibuprofen (200mg ) three times a day as needed for cramping/pain. - follow up in clinic in 4-6 weeks as scheduled for your regular post partum visit. You will have a glucose test again at this appointment. - Please come back to [facility] if you notice persistently elevated blood pressures or you start to have a headache, that doesn't get better with medications (tylenol and ibuprofen), rest (4hrs of sleep) and drinking water.

## 2022-04-05 ENCOUNTER — Encounter: Payer: Self-pay | Admitting: Obstetrics and Gynecology

## 2022-04-05 NOTE — Progress Notes (Signed)
   LOW-RISK PREGNANCY OFFICE VISIT Patient name: Audrey Cantrell MRN [id number]  Date of birth: 1985-07-04 Chief Complaint:   Routine Prenatal Visit  History of Present Illness:   Audrey Cantrell is a 37 y.o. [id number] female at [redacted]w[redacted]d with an Estimated Date of Delivery: 04/17/22 being seen today for ongoing management of a low-risk pregnancy.  Today she reports heartburn and would like a Rx. Contractions: Irritability. Vag. Bleeding: None.  Movement: Present. denies leaking of fluid. Review of Systems:   Pertinent items are noted in HPI Denies abnormal vaginal discharge w/ itching/odor/irritation, headaches, visual changes, shortness of breath, chest pain, abdominal pain, severe nausea/vomiting, or problems with urination or bowel movements unless otherwise stated above. Pertinent History Reviewed:  Reviewed past medical,surgical, social, obstetrical and family history.  Reviewed problem list, medications and allergies. Physical Assessment:   Vitals:   03/30/22 1616  BP: 112/73  Pulse: 78  Weight: 157 lb 6.4 oz (71.4 kg)  Body mass index is 27.88 kg/m.        Physical Examination:   General appearance: Well appearing, and in no distress  Mental status: Alert, oriented to person, place, and time  Skin: Warm & dry  Cardiovascular: Normal heart rate noted  Respiratory: Normal respiratory effort, no distress  Abdomen: Soft, gravid, nontender  Pelvic: Cervical exam deferred         Extremities: Edema: Trace  Fetal Status: Fetal Heart Rate (bpm): 139 Fundal Height: 38 cm Movement: Present    No results found for this or any previous visit (from the past 24 hour(s)).  Assessment & Plan:  1) Low-risk pregnancy [id number] at [redacted]w[redacted]d with an Estimated Date of Delivery: 04/17/22   2) Supervision of other normal pregnancy, antepartum - Reviewed labor s/sx's  3) Gestational diabetes mellitus (GDM) affecting pregnancy, antepartum - Review of Blood Sugar Levels: FBS range = 87-89 mg/dL  2 hr  PP range = 112-115  4) Heartburn during pregnancy in third trimester - Rx: Pepcid 20 mg qd  5) [redacted] weeks gestation of pregnancy     Meds:  Meds ordered this encounter  Medications   DISCONTD: famotidine (PEPCID) 20 MG tablet    Sig: Take 1 tablet (20 mg total) by mouth 2 (two) times daily.    Dispense:  60 tablet    Refill:  0    Order Specific Question:   Supervising Provider    Answer:   Audrey Jude [[id number]]   Labs/procedures today: none  Plan:  Continue routine obstetrical care   Reviewed: Term labor symptoms and general obstetric precautions including but not limited to vaginal bleeding, contractions, leaking of fluid and fetal movement were reviewed in detail with the patient.  All questions were answered. Has home bp cuff. Check bp weekly, let us know if >140/90.   Follow-up: Return in about 1 week (around 04/06/2022) for Return OB visit.  No orders of the defined types were placed in this encounter.  Laury Deep MSN, CNM 03/30/2022 4:44 PM

## 2022-04-05 NOTE — Discharge Summary (Signed)
Postpartum Discharge Summary   Patient Name: Audrey Cantrell DOB: 10/28/1985 MRN: [id number]  Date of admission: 04/03/2022 Delivery date:04/03/2022  Delivering provider: Lavonda Jumbo  Date of discharge: 04/05/2022  Admitting diagnosis: Encounter for induction of labor [Z34.90] Intrauterine pregnancy: [redacted]w[redacted]d     Secondary diagnosis:  Principal Problem:   Vaginal delivery Active Problems:   Antepartum multigravida of advanced maternal age   GBS (group b Streptococcus) UTI complicating pregnancy, first trimester   Gestational diabetes mellitus (GDM) affecting pregnancy, antepartum   LGA fetus   Encounter for induction of labor  Additional problems: None    Discharge diagnosis: Term Pregnancy Delivered and GDM A2                                              Post partum procedures: None Augmentation: AROM and Pitocin Complications: None  Hospital course: Induction of Labor With Vaginal Delivery   37 y.o. yo [id number] at [redacted]w[redacted]d was admitted to the hospital 04/03/2022 for induction of labor.  Indication for induction: A2 DM, AMA, and LGA .  Patient had an labor course complicated by precipitous delivery.  Membrane Rupture Time/Date: 1:42 PM ,04/03/2022   Delivery Method:Vaginal, Spontaneous  Episiotomy: None  Lacerations:  None  Details of delivery can be found in separate delivery note.  Patient had a postpartum course complicated by: none. Patient is discharged home 04/05/22.  Newborn Data: Birth date:04/03/2022  Birth time:2:49 PM  Gender:Female  Living status:Living  Apgars:9 ,9  Weight:3720 g   Magnesium Sulfate received: No BMZ received: No Rhophylac:No MMR:N/A, Rubella immune T-DaP:Given prenatally Flu: Given prenatally Transfusion:No  Physical exam  Vitals:   04/04/22 0417 04/04/22 1505 04/04/22 2034 04/05/22 0548  BP: 97/67 112/72 108/75 104/65  Pulse: 85 96 61 70  Resp: 17 18 16 18   Temp: 98.4 F (36.9 C) 98.3 F (36.8 C) 98.1 F (36.7 C) 98.6 F (37 C)   TempSrc:  Oral Oral Oral  SpO2: 98% 99% 100% 99%  Weight:      Height:       General: alert, cooperative, and no distress Lochia: appropriate Uterine Fundus: firm Incision: N/A DVT Evaluation: No evidence of DVT seen on physical exam. Labs: Lab Results  Component Value Date   WBC 9.6 04/03/2022   HGB 13.3 04/03/2022   HCT 38.5 04/03/2022   MCV 87.7 04/03/2022   PLT 209 04/03/2022      Latest Ref Rng & Units 12/02/2021    4:45 PM  CMP  Glucose 70 - 99 mg/dL 76   BUN 6 - 20 mg/dL 6   Creatinine 02/01/2022 - 2.68 mg/dL 3.41   Sodium 9.62 - 229 mmol/L 135   Potassium 3.5 - 5.2 mmol/L 3.8   Chloride 96 - 106 mmol/L 101   CO2 20 - 29 mmol/L 21   Calcium 8.7 - 10.2 mg/dL 8.8   Total Protein 6.0 - 8.5 g/dL 6.5   Total Bilirubin 0.0 - 1.2 mg/dL 0.5   Alkaline Phos 44 - 121 IU/L 56   AST 0 - 40 IU/L 14   ALT 0 - 32 IU/L 10    Edinburgh Score:    04/04/2022    7:45 AM  Edinburgh Postnatal Depression Scale Screening Tool  I have been able to laugh and see the funny side of things. 0  I have looked forward with enjoyment to  things. 0  I have blamed myself unnecessarily when things went wrong. 0  I have been anxious or worried for no good reason. 0  I have felt scared or panicky for no good reason. 1  Things have been getting on top of me. 0  I have been so unhappy that I have had difficulty sleeping. 0  I have felt sad or miserable. 0  I have been so unhappy that I have been crying. 0  The thought of harming myself has occurred to me. 0  Edinburgh Postnatal Depression Scale Total 1     After visit meds:  Allergies as of 04/05/2022   No Known Allergies      Medication List     STOP taking these medications    Accu-Chek Guide Me w/Device Kit   Accu-Chek Guide test strip Generic drug: glucose blood   Accu-Chek Softclix Lancets lancets   aspirin EC 81 MG tablet   famotidine 20 MG tablet Commonly known as: Pepcid   Gojji Weight Scale Misc   metFORMIN 500 MG  tablet Commonly known as: GLUCOPHAGE       TAKE these medications    Blood Pressure Kit Devi 1 kit by Does not apply route once a week.   cyclobenzaprine 5 MG tablet Commonly known as: FLEXERIL Take 1-2 tablets (5-10 mg total) by mouth 3 (three) times daily as needed for muscle spasms.   multivitamin-prenatal 27-0.8 MG Tabs tablet Take 1 tablet by mouth daily at 12 noon.         Discharge home in stable condition Infant Feeding: Bottle and Breast Infant Disposition:home with mother Discharge instruction: per After Visit Summary and Postpartum booklet. Activity: Advance as tolerated. Pelvic rest for 6 weeks.  Diet: routine diet Future Appointments: Future Appointments  Date Time Provider [place]  05/20/2022  8:45 AM [facility] None  05/20/2022  9:15 AM Luvenia Redden, PA-C [facility] None   Follow up Visit:  Message sent to [facility] by [place] on 04/05/2022  Please schedule this patient for a In person postpartum visit in 6 weeks with the following provider: Any provider. Additional Postpartum F/U:2 hour GTT  High risk pregnancy complicated by: GDM and LGA, AMA Delivery mode:  Vaginal, Spontaneous  Anticipated Birth Control:  PP Depo to be given prior to discharge  Christin Fudge, CNM  04/05/2022

## 2022-04-07 ENCOUNTER — Ambulatory Visit: Payer: Medicaid Other

## 2022-04-11 ENCOUNTER — Telehealth (HOSPITAL_COMMUNITY): Payer: Self-pay | Admitting: *Deleted

## 2022-04-11 NOTE — Telephone Encounter (Signed)
Patient voiced no questions or concerns regarding her health at this time. EPDS=0. Patient voiced no questions or concerns regarding infant at this time. Patient reports infant sleeps in a bassinet on her back. RN reviewed ABCs of safe sleep. Patient verbalized understanding. Patient informed about hospital's virtual postpartum classes and support groups. Declined email information at this time. Erline Levine, RN, 04/11/22, [phone]

## 2022-04-17 ENCOUNTER — Inpatient Hospital Stay (HOSPITAL_COMMUNITY): Admission: RE | Admit: 2022-04-17 | Payer: Medicaid Other | Source: Home / Self Care | Admitting: Family Medicine

## 2022-05-05 ENCOUNTER — Other Ambulatory Visit: Payer: Self-pay

## 2022-05-05 NOTE — Progress Notes (Signed)
Breast pump rx faxed to [facility]

## 2022-05-20 ENCOUNTER — Ambulatory Visit (INDEPENDENT_AMBULATORY_CARE_PROVIDER_SITE_OTHER): Payer: Medicaid Other | Admitting: Obstetrics and Gynecology

## 2022-05-20 ENCOUNTER — Other Ambulatory Visit: Payer: Medicaid Other

## 2022-05-20 ENCOUNTER — Encounter: Payer: Self-pay | Admitting: Obstetrics and Gynecology

## 2022-05-20 DIAGNOSIS — R87610 Atypical squamous cells of undetermined significance on cytologic smear of cervix (ASC-US): Secondary | ICD-10-CM

## 2022-05-20 DIAGNOSIS — O24419 Gestational diabetes mellitus in pregnancy, unspecified control: Secondary | ICD-10-CM

## 2022-05-20 MED ORDER — MEDROXYPROGESTERONE ACETATE 150 MG/ML IM SUSP: 150.0000 mg | INTRAMUSCULAR | 3 refills | Status: AC

## 2022-05-20 NOTE — Progress Notes (Signed)
[facility] Partum Visit Note  Branesha Mcintyre is a 37 y.o. [phone] female who presents for a postpartum visit. She is 6 weeks postpartum following a normal spontaneous vaginal delivery.  I have fully reviewed the prenatal and intrapartum course. The delivery was at 38.0 gestational weeks.  Anesthesia: epidural. Postpartum course has been good. Baby is doing well. Baby is feeding by both breast and bottle - Similac Advance. Bleeding thin lochia. Bowel function is normal. Bladder function is normal. Patient is sexually active. Contraception method is Depo-Provera injections.  Depo given in hospital on 04/04/22.  Postpartum depression screening: negative, score 0.   Upstream - 05/19/22 1342       Contraception Wrap Up   Current Method Hormonal Injection    End Method Hormonal Injection    Contraception Counseling Provided No    How was the end contraceptive method provided? Prescription            The pregnancy intention screening data noted above was reviewed. Potential methods of contraception were discussed. The patient elected to proceed with Hormonal Injection.   Edinburgh Postnatal Depression Scale - 05/20/22 0906       Edinburgh Postnatal Depression Scale:  In the Past 7 Days   I have been able to laugh and see the funny side of things. 0    I have looked forward with enjoyment to things. 0    I have blamed myself unnecessarily when things went wrong. 0    I have been anxious or worried for no good reason. 0    I have felt scared or panicky for no good reason. 0    Things have been getting on top of me. 0    I have been so unhappy that I have had difficulty sleeping. 0    I have felt sad or miserable. 0    I have been so unhappy that I have been crying. 0    The thought of harming myself has occurred to me. 0    Edinburgh Postnatal Depression Scale Total 0             Health Maintenance Due  Topic Date Due   FOOT EXAM  Never done   OPHTHALMOLOGY EXAM  Never done    HEMOGLOBIN A1C  07/05/2021   COVID-19 Vaccine (4 - 2023-24 season) 11/26/2021    The following portions of the patient's history were reviewed and updated as appropriate: allergies, current medications, past family history, past medical history, past social history, past surgical history, and problem list.  Review of Systems Pertinent items are noted in HPI.  Objective:  BP 108/70   Pulse 93   Wt 142 lb (64.4 kg)   LMP 07/11/2021   BMI 25.15 kg/m    General:  alert, cooperative, and no distress   Breasts:  not indicated  Lungs: Normal respiratory effort  Heart:  Normal peripheral perfusion, normal rate  Abdomen: Soft, nontender         Assessment:   Tamani was seen today for postpartum care.  Postpartum exam -     medroxyPROGESTERone (DEPO-PROVERA) 150 MG/ML injection; Inject 1 mL (150 mg total) into the muscle every 3 (three) months. 2. Gestational diabetes mellitus (GDM), antepartum, gestational diabetes method of control unspecified -     Glucose tolerance, 2 hours; Future 3. Atypical squamous cell changes of undetermined significance (ASCUS) on cervical cytology with negative high risk human papilloma virus (HPV) test result  - Due for pap 02/2023  Plan:  Essential components of care per ACOG recommendations:  1.  Mood and well being: Patient with negative depression screening today. Reviewed local resources for support.  - Patient tobacco use? No.   - hx of drug use? No.    2. Infant care and feeding:  -Patient currently breastmilk feeding? Yes. Reviewed importance of draining breast regularly to support lactation.  -Social determinants of health (SDOH) reviewed in EPIC. No concerns  3. Sexuality, contraception and birth spacing - Patient does not want a pregnancy in the next year.  Desired family size is 3-4 children.  - Reviewed reproductive life planning. Reviewed contraceptive methods based on pt preferences and effectiveness.  Patient desired Hormonal  Injection today.   - Discussed birth spacing of 18 months  4. Sleep and fatigue -Encouraged family/partner/community support of 4 hrs of uninterrupted sleep to help with mood and fatigue  5. Physical Recovery  - Discussed patients delivery and complications. She describes her labor as good. - Patient had a Vaginal, no problems at delivery.  - Patient has urinary incontinence? No. - Patient is safe to resume physical and sexual activity  6.  Health Maintenance - HM due items addressed Yes - Last pap smear  Diagnosis  Date Value Ref Range Status  03/17/2020 (A)  Final   - Atypical squamous cells of undetermined significance (ASC-US)   Pap smear not done at today's visit. Patient declined and desired pap at a future appointment. Discussed pap due 02/2023 -Breast Cancer screening indicated? No.   7. Chronic Disease/Pregnancy Condition follow up: Gestational Diabetes - 2h GTT ordered - Discussed maintaining healthy lifestyle changes - PCP follow up  Inez Catalina, [place] for [facility]

## 2022-05-25 ENCOUNTER — Other Ambulatory Visit: Payer: Medicaid Other

## 2022-05-30 ENCOUNTER — Other Ambulatory Visit: Payer: Medicaid Other

## 2022-05-30 DIAGNOSIS — Z8759 Personal history of other complications of pregnancy, childbirth and the puerperium: Secondary | ICD-10-CM

## 2022-05-31 LAB — GLUCOSE TOLERANCE, 2 HOURS
Glucose, 2 hour: 107 mg/dL (ref 70–139)
Glucose, GTT - Fasting: 94 mg/dL (ref 70–99)

## 2022-07-05 ENCOUNTER — Ambulatory Visit: Payer: Medicaid Other | Admitting: Advanced Practice Midwife

## 2022-07-11 ENCOUNTER — Other Ambulatory Visit: Payer: Self-pay | Admitting: Internal Medicine

## 2022-07-12 LAB — COMPLETE METABOLIC PANEL WITH GFR
AG Ratio: 1.7 (calc) (ref 1.0–2.5)
ALT: 22 U/L (ref 6–29)
AST: 18 U/L (ref 10–30)
Albumin: 4.3 g/dL (ref 3.6–5.1)
Alkaline phosphatase (APISO): 69 U/L (ref 31–125)
BUN: 12 mg/dL (ref 7–25)
CO2: 22 mmol/L (ref 20–32)
Calcium: 9.2 mg/dL (ref 8.6–10.2)
Chloride: 106 mmol/L (ref 98–110)
Creat: 0.68 mg/dL (ref 0.50–0.97)
Globulin: 2.6 g/dL (calc) (ref 1.9–3.7)
Glucose, Bld: 107 mg/dL — ABNORMAL HIGH (ref 65–99)
Potassium: 3.9 mmol/L (ref 3.5–5.3)
Sodium: 139 mmol/L (ref 135–146)
Total Bilirubin: 1.3 mg/dL — ABNORMAL HIGH (ref 0.2–1.2)
Total Protein: 6.9 g/dL (ref 6.1–8.1)
eGFR: 114 mL/min/{1.73_m2} (ref >=60)

## 2022-07-12 LAB — CBC
HCT: 38.2 % (ref 35.0–45.0)
Hemoglobin: 13.1 g/dL (ref 11.7–15.5)
MCH: 29.6 pg (ref 27.0–33.0)
MCHC: 34.3 g/dL (ref 32.0–36.0)
MCV: 86.2 fL (ref 80.0–100.0)
MPV: 13.2 fL — ABNORMAL HIGH (ref 7.5–12.5)
Platelets: 243 10*3/uL (ref 140–400)
RBC: 4.43 10*6/uL (ref 3.80–5.10)
RDW: 12.1 % (ref 11.0–15.0)
WBC: 7.2 10*3/uL (ref 3.8–10.8)

## 2022-07-12 LAB — LIPID PANEL
Cholesterol: 139 mg/dL (ref <200)
HDL: 61 mg/dL (ref >=50)
LDL Cholesterol (Calc): 44 mg/dL (calc)
Non-HDL Cholesterol (Calc): 78 mg/dL (calc) (ref <130)
Total CHOL/HDL Ratio: 2.3 (calc) (ref <5.0)
Triglycerides: 292 mg/dL — ABNORMAL HIGH (ref <150)

## 2022-07-12 LAB — VITAMIN D 25 HYDROXY (VIT D DEFICIENCY, FRACTURES): Vit D, 25-Hydroxy: 44 ng/mL (ref 30–100)

## 2023-01-10 ENCOUNTER — Ambulatory Visit (INDEPENDENT_AMBULATORY_CARE_PROVIDER_SITE_OTHER): Payer: Managed Care, Other (non HMO)

## 2023-01-10 DIAGNOSIS — Z3202 Encounter for pregnancy test, result negative: Secondary | ICD-10-CM

## 2023-01-10 DIAGNOSIS — Z3042 Encounter for surveillance of injectable contraceptive: Secondary | ICD-10-CM | POA: Diagnosis not present

## 2023-01-10 LAB — POCT URINE PREGNANCY: Preg Test, Ur: NEGATIVE

## 2023-01-10 MED ORDER — MEDROXYPROGESTERONE ACETATE 150 MG/ML IM SUSP
150.0000 mg | Freq: Once | INTRAMUSCULAR | Status: AC
  Administered 2023-01-10: 150 mg via INTRAMUSCULAR

## 2023-01-10 NOTE — Progress Notes (Signed)
Pt is in the office for depo injection. Pt has been receiving depo at [facility], last injection 10/10/22. UPT in office is negative today. Administered in [place] and pt tolerated well. Next due Dec 31- Jan 14 .Marland Kitchen Administrations This Visit     medroxyPROGESTERone (DEPO-PROVERA) injection 150 mg     Admin Date 01/10/2023 Action Given Dose 150 mg Route Intramuscular Documented By Katrina Stack, RN

## 2023-06-27 ENCOUNTER — Ambulatory Visit (INDEPENDENT_AMBULATORY_CARE_PROVIDER_SITE_OTHER): Payer: Self-pay

## 2023-06-27 VITALS — BP 104/70 | HR 78

## 2023-06-27 DIAGNOSIS — Z3042 Encounter for surveillance of injectable contraceptive: Secondary | ICD-10-CM

## 2023-06-27 DIAGNOSIS — Z3202 Encounter for pregnancy test, result negative: Secondary | ICD-10-CM | POA: Diagnosis not present

## 2023-06-27 LAB — POCT URINE PREGNANCY: Preg Test, Ur: NEGATIVE

## 2023-06-27 MED ORDER — MEDROXYPROGESTERONE ACETATE 150 MG/ML IM SUSP
150.0000 mg | Freq: Once | INTRAMUSCULAR | Status: AC
  Administered 2023-06-27: 150 mg via INTRAMUSCULAR

## 2023-06-27 NOTE — Progress Notes (Signed)
 Date last pap: 03/17/20, pt due. Last Depo-Provera: per pt, last received with [facility] Jan 14th (within window). UPT in office today done due to no documentation of jan 14 injection, UPT negative, no unprotected intercourse in past two weeks. Side Effects if any: NA. Serum HCG indicated? NA. Depo-Provera 150 mg IM given by: Karma Ganja, RN. Next appointment due June 17-September 26 2023.

## 2023-09-18 ENCOUNTER — Encounter: Payer: Self-pay | Admitting: Advanced Practice Midwife

## 2023-09-18 ENCOUNTER — Ambulatory Visit (INDEPENDENT_AMBULATORY_CARE_PROVIDER_SITE_OTHER): Payer: Self-pay | Admitting: Advanced Practice Midwife

## 2023-09-18 ENCOUNTER — Ambulatory Visit

## 2023-09-18 VITALS — BP 103/68 | HR 83 | Ht 63.0 in | Wt 137.4 lb

## 2023-09-18 DIAGNOSIS — Z3009 Encounter for other general counseling and advice on contraception: Secondary | ICD-10-CM

## 2023-09-18 DIAGNOSIS — Z3042 Encounter for surveillance of injectable contraceptive: Secondary | ICD-10-CM | POA: Insufficient documentation

## 2023-09-18 DIAGNOSIS — R87610 Atypical squamous cells of undetermined significance on cytologic smear of cervix (ASC-US): Secondary | ICD-10-CM | POA: Diagnosis not present

## 2023-09-18 MED ORDER — MEDROXYPROGESTERONE ACETATE 150 MG/ML IM SUSP
150.0000 mg | Freq: Once | INTRAMUSCULAR | Status: AC
  Administered 2023-09-18: 150 mg via INTRAMUSCULAR

## 2023-09-18 NOTE — Progress Notes (Addendum)
   GYNECOLOGY PROGRESS NOTE  History:  38 y.o. [id number] presents to [facility] office today for problem gyn visit. She would like to discuss options for contraception more long term than Depo Provera .  The following portions of the patient's history were reviewed and updated as appropriate: allergies, current medications, past family history, past medical history, past social history, past surgical history and problem list. Last pap smear on 03/17/20 was abnormal, see below:     Component Value Date/Time   DIAGPAP (A) 03/17/2020 1130    - Atypical squamous cells of undetermined significance (ASC-US )   HPVHIGH Negative 03/17/2020 1130   ADEQPAP  03/17/2020 1130    Satisfactory but limited for evaluation with scant cellularity;   ADEQPAP transformation zone component present. 03/17/2020 1130    Health Maintenance Due  Topic Date Due   FOOT EXAM  Never done   OPHTHALMOLOGY EXAM  Never done   Diabetic kidney evaluation - Urine ACR  Never done   HPV VACCINES (1 - Risk 3-dose SCDM series) Never done   HEMOGLOBIN A1C  07/05/2021   COVID-19 Vaccine (4 - 2024-25 season) 11/27/2022   Diabetic kidney evaluation - eGFR measurement  07/11/2023     Review of Systems:  Pertinent items are noted in HPI.   Objective:  Physical Exam Blood pressure 103/68, pulse 83, height 5' 3 (1.6 m), weight 62.3 kg, not currently breastfeeding. VS reviewed, nursing note reviewed Constitutional: well developed, well nourished, no distress HEENT: normocephalic CV: normal rate Pulm/chest wall: normal effort Abdomen: soft Neuro: alert and oriented x 3 Skin: appears normal Psych: affect normal  Assessment & Plan:   1. Encounter for counseling regarding contraception (Primary) Not interested in future pregnancies. Currently using Depo Provera  for contraception with no adverse side effects. Would like longer term contraception. Previously used Nexplanon  and experienced irregular vaginal bleeding, removed at  6 mon due to bleeding. Reviewed IUD, BTL, and vasectomy. Interested in IUD. Reviewed hormonal vs non-hormonal IUD. Strongly considering hormonal IUD. Would like to discuss with partner prior to insertion. Plan to schedule appt in 3 months for annual with either Depo or IUD insertion.  2. Encounter for Depo-Provera  contraception Depo given today   3. Atypical squamous cell changes of undetermined significance (ASCUS) on cervical cytology with negative high risk human papilloma virus (HPV) test result Pap due, declines today, would like to complete at annual exam  Return in about 3 months (around 12/19/2023) for annual with possible IUD insertion.   Vernell FORBES Ruddle, Student-MidWife 4:53 PM   Midwife Attestation:  I personally saw and evaluated the patient, performing the key elements of the service. I developed and verified the management plan that is described in the resident's/student's note, and I agree with the content with my edits above. VSS, HRR&R, Resp unlabored, Legs neg.    Olam Boards, CNM 6:10 PM

## 2023-09-18 NOTE — Progress Notes (Signed)
 Pt presents for AEX.  Last PAP 03-17-20 Declines PAP and STD testing  Depo injection due now Interested long term [facility]

## 2023-09-24 IMAGING — US US OB < 14 WEEKS - US OB TV
1 series · 15 of 28 positions shown · non-contrast
Comparison: None.

CLINICAL DATA: Early pregnancy.  Bleeding.  Beta HCG of 986.

EXAM:
OBSTETRIC <14 WK US AND TRANSVAGINAL OB US
TECHNIQUE: Both transabdominal and transvaginal ultrasound examinations were
performed for complete evaluation of the gestation as well as the
maternal uterus, adnexal regions, and pelvic cul-de-sac.
Transvaginal technique was performed to assess early pregnancy.

[Series 1: us ob < 14 weeks - us ob tv · 15 of 116 slices shown]
[im 1/116]
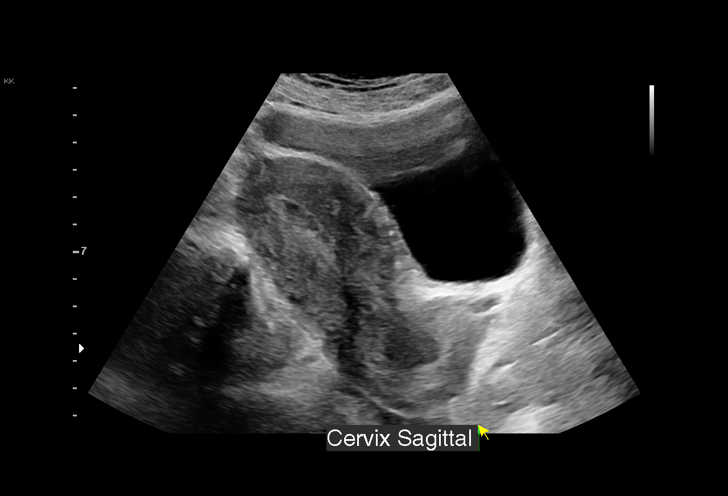
[im 9/116]
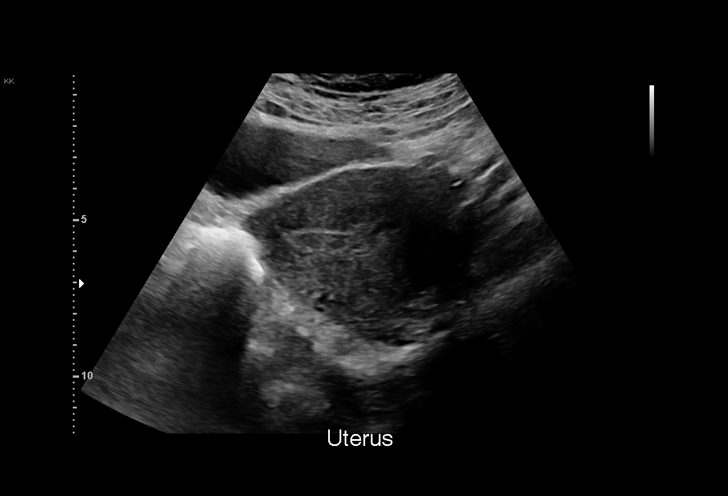
[im 18/116]
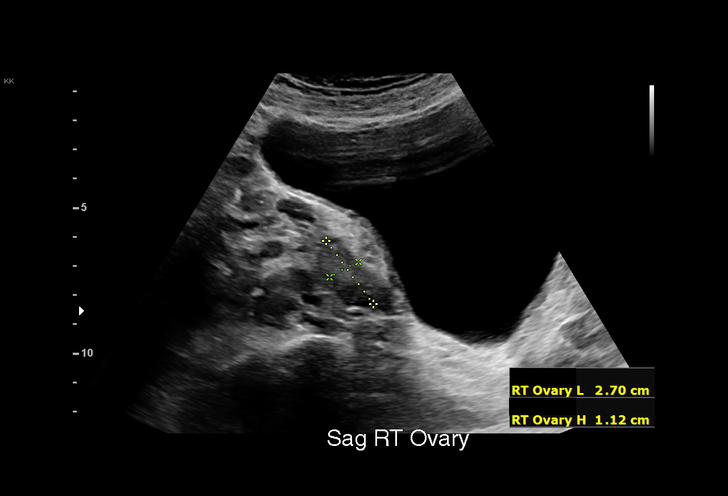
[im 26/116]
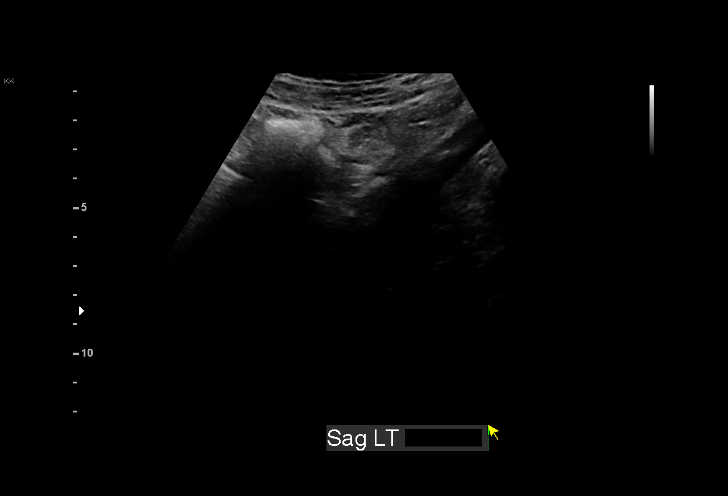
[im 35/116]
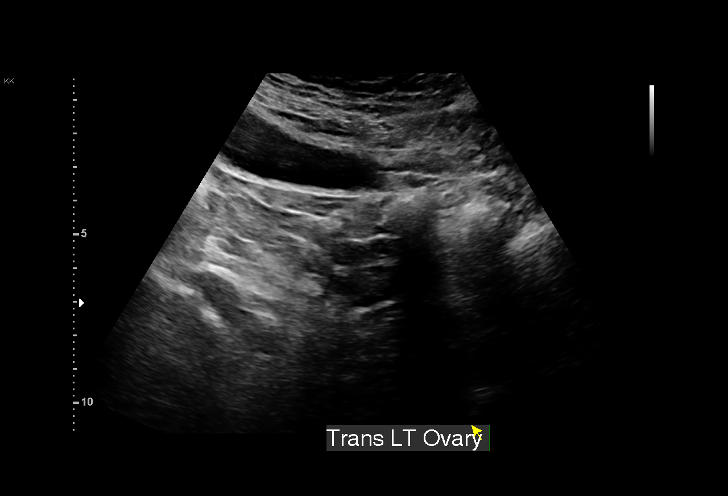
[im 43/116]
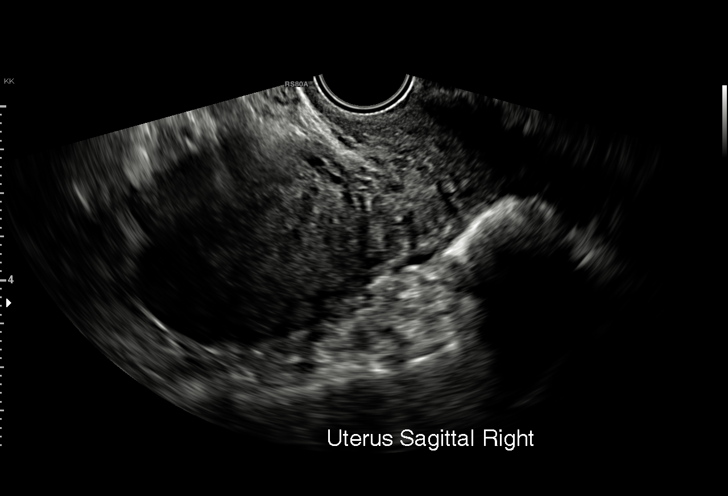
[im 52/116]
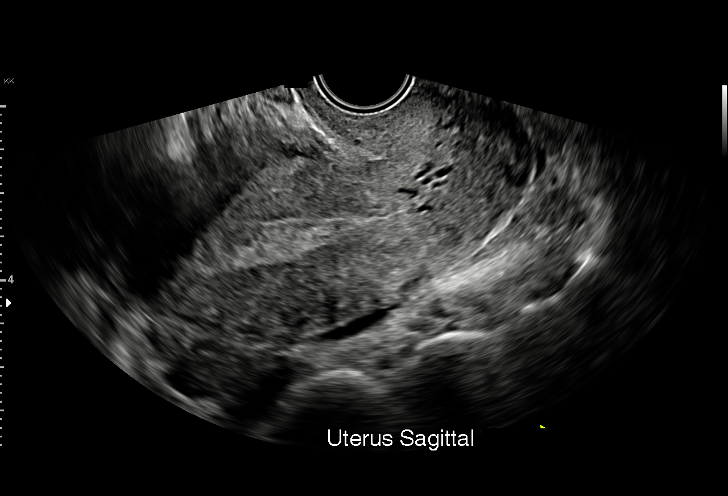
[im 60/116]
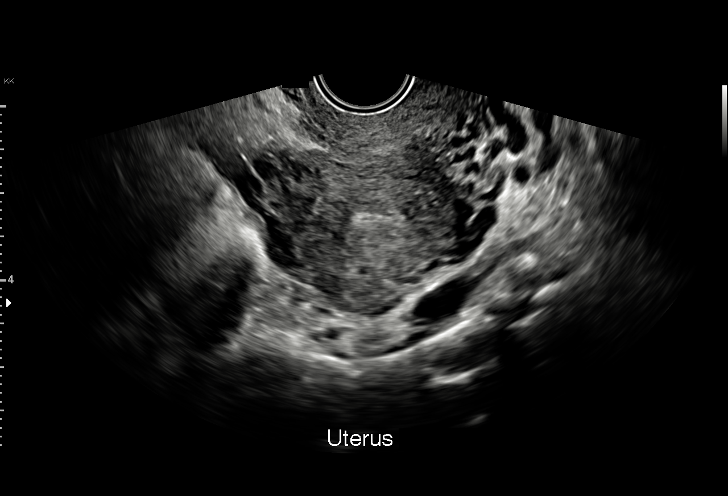
[im 64/116]
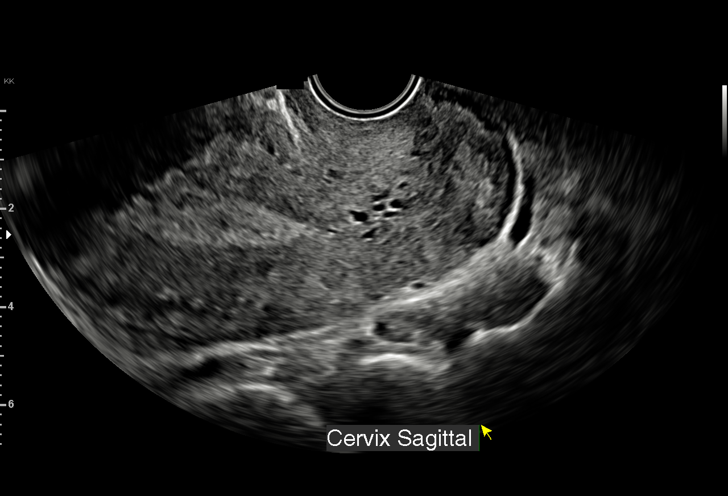
[im 73/116]
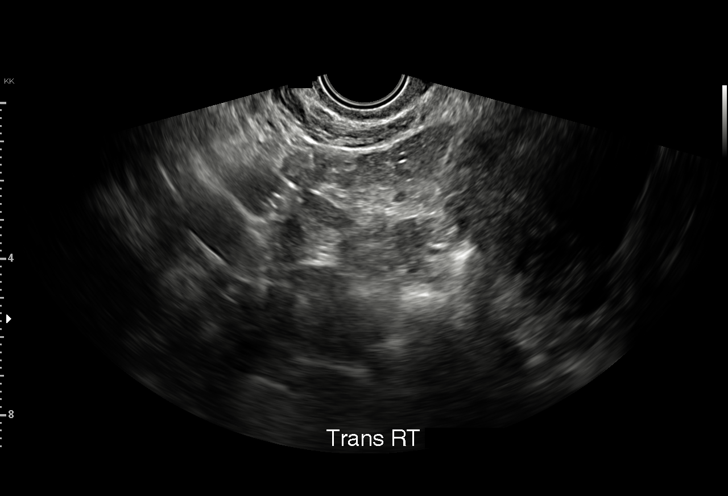
[im 81/116]
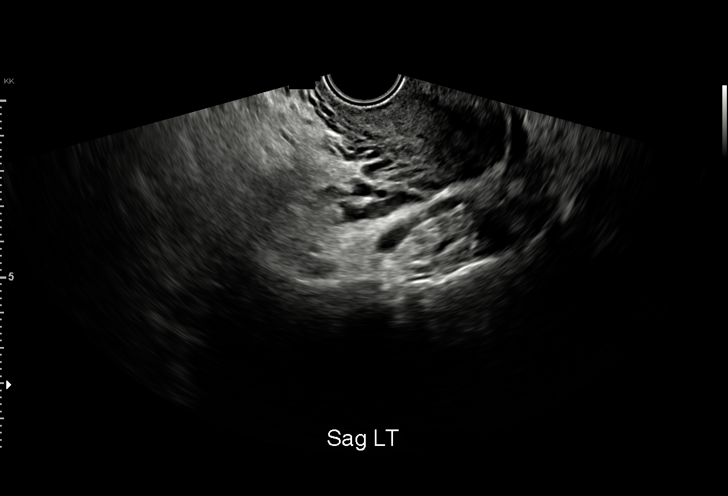
[im 90/116]
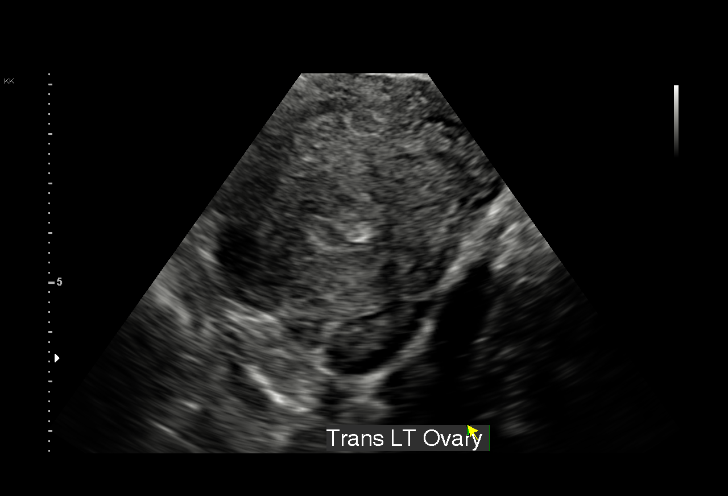
[im 98/116]
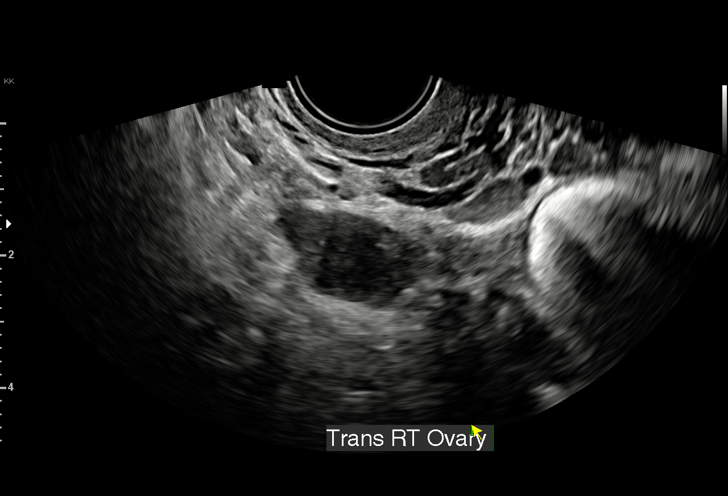
[im 107/116]
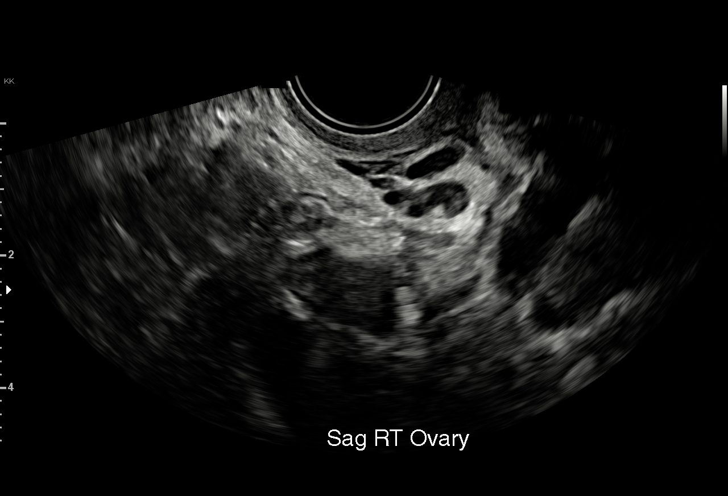
[im 116/116]
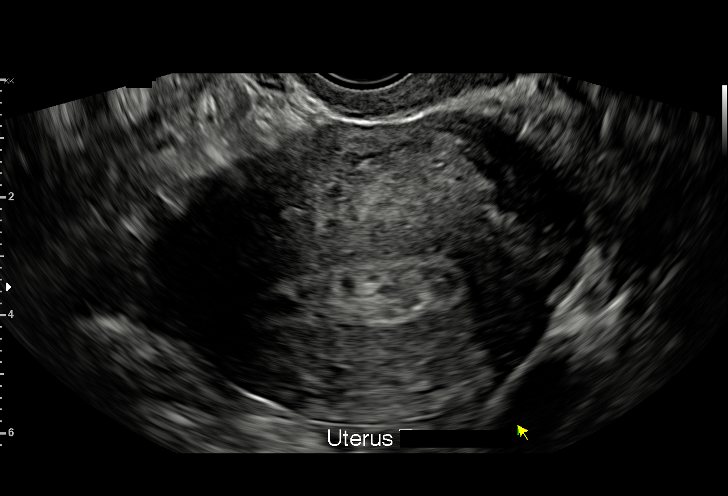

[15 of 28 positions shown; findings below may reference images not displayed]

FINDINGS: Intrauterine gestational sac: Absent

Yolk sac:  Absent

Embryo:  Absent

Cardiac Activity: Absent

Subchorionic hemorrhage:  None visualized.

Maternal uterus/adnexae: Probable right ovarian corpus luteal cyst,
including at 1.1 cm on image 102.

Trace pelvic fluid.
IMPRESSION: Lack of intrauterine gestational sac, yolk sac, or fetal pole. Given
beta HCG level, differential considerations include early
intrauterine pregnancy, ectopic pregnancy, or missed abortion.
Recommend beta HCG surveillance and possibly ultrasound follow-up.

Probable right ovarian corpus luteal cyst.

## 2023-12-04 ENCOUNTER — Encounter: Payer: Self-pay | Admitting: Obstetrics and Gynecology

## 2023-12-04 ENCOUNTER — Ambulatory Visit (INDEPENDENT_AMBULATORY_CARE_PROVIDER_SITE_OTHER): Admitting: Obstetrics and Gynecology

## 2023-12-04 VITALS — BP 97/74 | HR 77 | Wt 139.8 lb

## 2023-12-04 DIAGNOSIS — Z01411 Encounter for gynecological examination (general) (routine) with abnormal findings: Secondary | ICD-10-CM | POA: Diagnosis not present

## 2023-12-04 DIAGNOSIS — Z01419 Encounter for gynecological examination (general) (routine) without abnormal findings: Secondary | ICD-10-CM | POA: Diagnosis not present

## 2023-12-04 DIAGNOSIS — Z0000 Encounter for general adult medical examination without abnormal findings: Secondary | ICD-10-CM

## 2023-12-04 DIAGNOSIS — L700 Acne vulgaris: Secondary | ICD-10-CM | POA: Diagnosis not present

## 2023-12-04 DIAGNOSIS — Z30430 Encounter for insertion of intrauterine contraceptive device: Secondary | ICD-10-CM

## 2023-12-04 MED ORDER — CLINDAMYCIN-TRETINOIN 1.2-0.025 % EX GEL: Freq: Every day | CUTANEOUS | 0 refills | Status: AC

## 2023-12-04 MED ORDER — DOXYCYCLINE HYCLATE 100 MG PO CAPS: 100.0000 mg | ORAL_CAPSULE | Freq: Two times a day (BID) | ORAL | 0 refills | Status: AC

## 2023-12-04 MED ORDER — LEVONORGESTREL 20 MCG/DAY IU IUD
1.0000 | INTRAUTERINE_SYSTEM | Freq: Once | INTRAUTERINE | Status: AC
  Administered 2023-12-04: 1 via INTRAUTERINE

## 2023-12-04 NOTE — Progress Notes (Signed)
 ANNUAL EXAM Patient name: Audrey Cantrell MRN [id number]  Date of birth: May 14, 1985 Chief Complaint:   No chief complaint on file.  History of Present Illness:   Audrey Cantrell is a 38 y.o. [phone]  female being seen today for a routine annual exam.  Current complaints: significant hormonal acne around chin and face. Appt with derm the end of October  Desires IUD placement, is currently in depo window   Patient's last menstrual period was 11/18/2023 (exact date).   The pregnancy intention screening data noted above was reviewed. Potential methods of contraception were discussed. The patient elected to proceed with IUD  Gynecologic History Patient's last menstrual period was . Contraception:  Last Pap:2021 . Results were: ASCUS neg hpv Last mammogram: n/a     09/18/2023    4:21 PM 03/23/2022    3:43 PM 02/03/2022    9:06 AM 09/21/2021    3:38 PM 02/15/2021    4:26 PM  Depression screen PHQ 2/9  Decreased Interest 0 0 0 0 0  Down, Depressed, Hopeless 0 0 0 0 0  PHQ - 2 Score 0 0 0 0 0  Altered sleeping 0 0 0 0 0  Tired, decreased energy 0 1 0 1 1  Change in appetite 0 0 0 1 0  Feeling bad or failure about yourself  0 0 0 0 0  Trouble concentrating 0 0 0 0 0  Moving slowly or fidgety/restless 0 0 0 0 0  Suicidal thoughts 0 0 0 0 0  PHQ-9 Score 0 1 0 2 1  Difficult doing work/chores   Not difficult at all  Somewhat difficult        09/18/2023    4:21 PM 03/23/2022    3:44 PM 02/03/2022    9:06 AM 09/21/2021    3:38 PM  GAD 7 : Generalized Anxiety Score  Nervous, Anxious, on Edge 0 0 0 0  Control/stop worrying 0 0 0 0  Worry too much - different things 0 0 0 0  Trouble relaxing 0 0 0 0  Restless 0 0 0 0  Easily annoyed or irritable 0 0 0 0  Afraid - awful might happen 0 0 0 0  Total GAD 7 Score 0 0 0 0  Anxiety Difficulty   Not difficult at all      Review of Systems:   Pertinent items are noted in HPI Denies any headaches, blurred vision, fatigue, shortness  of breath, chest pain, abdominal pain, abnormal vaginal discharge/itching/odor/irritation, problems with periods, bowel movements, urination, or intercourse unless otherwise stated above. Pertinent History Reviewed:  Reviewed past medical,surgical, social and family history.  Reviewed problem list, medications and allergies. Physical Assessment:   Vitals:   12/04/23 1308  BP: 97/74  Pulse: 77  Weight: 139 lb 12.8 oz (63.4 kg)  Body mass index is 24.76 kg/m.        Physical Examination:   General appearance - well appearing, and in no distress  Mental status - alert, oriented   Psych:  She has a normal mood and affect  Skin - warm and dry evidence cystic acne chin and bilateral check  Chest - effort normal, all lung fields clear to auscultation bilaterally  Heart - normal rate and regular rhythm  Neck:  midline trachea  Breasts - breasts appear normal, no suspicious masses, no skin or nipple changes or  axillary nodes  Abdomen - soft, nontender, nondistended  Pelvic - VULVA: normal appearing vulva with no masses, tenderness or  lesions  VAGINA: normal appearing vagina with normal color and discharge, no lesions  CERVIX: normal appearing cervix without discharge or lesions, no CMT  Thin prep pap is done w HR HPV cotesting  Extremities:  No swelling or varicosities noted  Chaperone present for exam  IUD Insertion Procedure Note Patient identified, informed consent performed, consent signed.   Discussed risks of irregular bleeding, cramping, infection, malpositioning or misplacement of the IUD outside the uterus which may require further procedure such as laparoscopy. Time out was performed.  In depo window  Speculum placed in the vagina.  Cervix visualized.  Cleaned with Betadine x 2.  Grasped anteriorly with a single tooth tenaculum.  Uterus sounded to 6 cm.  Mirena  IUD placed per manufacturer's recommendations.  Strings trimmed to 3 cm. Tenaculum was removed, good hemostasis noted.   Patient tolerated procedure well.   Patient was given post-procedure instructions.  She was advised to have backup contraception for one week.  Patient was also asked to check IUD strings periodically and follow up in 4 weeks for IUD check.   Assessment & Plan:  1. Encounter for annual routine gynecological examination (Primary) Pap today Discussed Mammogram next year - Cytology - PAP( [facility])  2. Acne vulgaris Desires oral abx  - clindamycin -tretinoin  (ZIANA ) gel; Apply topically at bedtime. Apply topically once at bedtime  Dispense: 30 g; Refill: 0 - doxycycline  (VIBRAMYCIN ) 100 MG capsule; Take 1 capsule (100 mg total) by mouth 2 (two) times daily for 14 days.  Dispense: 28 capsule; Refill: 0  3. Encounter for IUD insertion See procedure note above - levonorgestrel  (MIRENA ) 20 MCG/DAY IUD 1 each   Labs/procedures today:   Mammogram: @ 40yo, or sooner if problems Colonoscopy: @ 39yo, or sooner if problems  No orders of the defined types were placed in this encounter.   Meds:  Meds ordered this encounter  Medications   clindamycin -tretinoin  (ZIANA ) gel    Sig: Apply topically at bedtime. Apply topically once at bedtime    Dispense:  30 g    Refill:  0   doxycycline  (VIBRAMYCIN ) 100 MG capsule    Sig: Take 1 capsule (100 mg total) by mouth 2 (two) times daily for 14 days.    Dispense:  28 capsule    Refill:  0   levonorgestrel  (MIRENA ) 20 MCG/DAY IUD 1 each    Follow-up: Return in about 4 weeks (around 01/01/2024), or IUD string check.   Nidia Daring, FNP

## 2023-12-04 NOTE — Patient Instructions (Signed)

## 2023-12-04 NOTE — Progress Notes (Signed)
 3 weeks ago pt started bleeding on and off. She is currently on the depo injection. Here for IUD (Mirena ) insertion today.  Pt declines PAP and STD testing today.   Pt would like to discuss hormonal acne with provider and possible treatments.

## 2023-12-08 LAB — CYTOLOGY - PAP: Adequacy: ABNORMAL

## 2023-12-10 ENCOUNTER — Ambulatory Visit: Payer: Self-pay | Admitting: Obstetrics and Gynecology

## 2024-01-02 ENCOUNTER — Other Ambulatory Visit (HOSPITAL_COMMUNITY)
Admission: RE | Admit: 2024-01-02 | Discharge: 2024-01-02 | Disposition: A | Discharge status: Home / Self Care | Source: Ambulatory Visit | Attending: Obstetrics and Gynecology | Admitting: Obstetrics and Gynecology

## 2024-01-02 ENCOUNTER — Encounter: Payer: Self-pay | Admitting: Obstetrics and Gynecology

## 2024-01-02 ENCOUNTER — Ambulatory Visit (INDEPENDENT_AMBULATORY_CARE_PROVIDER_SITE_OTHER): Payer: Self-pay | Admitting: Obstetrics and Gynecology

## 2024-01-02 VITALS — BP 102/65 | HR 78 | Ht 63.0 in | Wt 144.0 lb

## 2024-01-02 DIAGNOSIS — Z30431 Encounter for routine checking of intrauterine contraceptive device: Secondary | ICD-10-CM

## 2024-01-02 DIAGNOSIS — Z124 Encounter for screening for malignant neoplasm of cervix: Secondary | ICD-10-CM

## 2024-01-02 NOTE — Progress Notes (Signed)
 Pt presents for IUD string check. No concerns  Needs repeat PAP

## 2024-01-02 NOTE — Progress Notes (Signed)
   GYNECOLOGY PROGRESS NOTE  History:  37 y.o. [id number] presents to [facility] for IUD string check. Doing well with IUD, no complaints   Health Maintenance Due  Topic Date Due   FOOT EXAM  Never done   OPHTHALMOLOGY EXAM  Never done   Diabetic kidney evaluation - Urine ACR  Never done   Pneumococcal Vaccine (1 of 2 - PCV) Never done   Hepatitis B Vaccines 19-59 Average Risk (1 of 3 - 19+ 3-dose series) Never done   HPV VACCINES (1 - Risk 3-dose SCDM series) Never done   HEMOGLOBIN A1C  07/05/2021   Diabetic kidney evaluation - eGFR measurement  07/11/2023   Influenza Vaccine  10/27/2023   COVID-19 Vaccine (4 - 2025-26 season) 11/27/2023     Review of Systems:  Pertinent items are noted in HPI.   Objective:  Physical Exam Blood pressure 102/65, pulse 78, height 5' 3 (1.6 m), weight 144 lb (65.3 kg), last menstrual period 11/18/2023, not currently breastfeeding. VS reviewed, nursing note reviewed,  Constitutional: well developed, well nourished, no distress HEENT: normocephalic CV: normal rate Pulm/chest wall: normal effort Skin: warm, dry Psych: affect normal Pelvic exam: Pelvic: normal appearing vulva with no masses, tenderness or lesions  VAGINA: normal appearing vagina with normal color and discharge, no lesions  CERVIX: normal appearing cervix without discharge or lesions,  Thin prep pap is done w HR HPV cotesting  IUD strings not visualized  Assessment & Plan:  1. IUD check up (Primary) Expire 2033 unless desires removal sooner IUD in place visualized with bedside u/s   2. Cervical cancer screening Repeat today - Cytology - PAP( [place])     Nidia Daring, FNP

## 2024-01-08 ENCOUNTER — Ambulatory Visit: Payer: Self-pay | Admitting: Obstetrics and Gynecology

## 2024-01-08 LAB — CYTOLOGY - PAP
Comment: NEGATIVE
Diagnosis: NEGATIVE
High risk HPV: NEGATIVE
